# Patient Record
Sex: Female | Born: 1940 | Hispanic: No | Smoking: Former smoker
Health system: Southern US, Community
[De-identification: ages and names within clinical notes are randomized; demographics above are authoritative.]

## PROBLEM LIST (undated history)

## (undated) DIAGNOSIS — J449 Chronic obstructive pulmonary disease, unspecified: Secondary | ICD-10-CM

## (undated) DIAGNOSIS — I4891 Unspecified atrial fibrillation: Secondary | ICD-10-CM

## (undated) DIAGNOSIS — G8929 Other chronic pain: Secondary | ICD-10-CM

## (undated) DIAGNOSIS — I82409 Acute embolism and thrombosis of unspecified deep veins of unspecified lower extremity: Secondary | ICD-10-CM

## (undated) DIAGNOSIS — E079 Disorder of thyroid, unspecified: Secondary | ICD-10-CM

## (undated) DIAGNOSIS — M069 Rheumatoid arthritis, unspecified: Secondary | ICD-10-CM

## (undated) DIAGNOSIS — I509 Heart failure, unspecified: Secondary | ICD-10-CM

## (undated) DIAGNOSIS — I499 Cardiac arrhythmia, unspecified: Secondary | ICD-10-CM

## (undated) DIAGNOSIS — IMO0002 Reserved for concepts with insufficient information to code with codable children: Secondary | ICD-10-CM

## (undated) DIAGNOSIS — F32A Depression, unspecified: Secondary | ICD-10-CM

## (undated) DIAGNOSIS — I10 Essential (primary) hypertension: Secondary | ICD-10-CM

## (undated) DIAGNOSIS — M329 Systemic lupus erythematosus, unspecified: Secondary | ICD-10-CM

## (undated) HISTORY — DX: Chronic obstructive pulmonary disease, unspecified: J44.9

## (undated) HISTORY — DX: Heart failure, unspecified: I50.9

## (undated) HISTORY — DX: Acute embolism and thrombosis of unspecified deep veins of unspecified lower extremity: I82.409

## (undated) HISTORY — DX: Unspecified atrial fibrillation: I48.91

## (undated) HISTORY — PX: OTHER SURGICAL HISTORY: SHX169

## (undated) HISTORY — DX: Rheumatoid arthritis, unspecified: M06.9

## (undated) HISTORY — DX: Essential (primary) hypertension: I10

## (undated) HISTORY — DX: Other chronic pain: G89.29

## (undated) HISTORY — DX: Disorder of thyroid, unspecified: E07.9

---

## 1983-07-13 HISTORY — PX: ABDOMINAL HYSTERECTOMY: SHX81

## 1984-07-12 HISTORY — PX: OTHER SURGICAL HISTORY: SHX169

## 2008-04-10 HISTORY — PX: OTHER SURGICAL HISTORY: SHX169

## 2015-04-07 ENCOUNTER — Ambulatory Visit (INDEPENDENT_AMBULATORY_CARE_PROVIDER_SITE_OTHER): Payer: Medicare Other | Admitting: Diagnostic Neuroimaging

## 2015-04-07 ENCOUNTER — Encounter: Payer: Self-pay | Admitting: *Deleted

## 2015-04-07 VITALS — BP 135/77 | HR 62 | Ht 65.0 in | Wt 264.4 lb

## 2015-04-07 DIAGNOSIS — M5010 Cervical disc disorder with radiculopathy, unspecified cervical region: Secondary | ICD-10-CM | POA: Diagnosis not present

## 2015-04-07 DIAGNOSIS — M542 Cervicalgia: Secondary | ICD-10-CM | POA: Diagnosis not present

## 2015-04-07 NOTE — Progress Notes (Signed)
[facility]  PATIENT: Shirley Waters DOB: 1941/05/02  REFERRING CLINICIAN: Rockne Menghini HISTORY FROM: patient  REASON FOR VISIT: new consult    HISTORICAL  CHIEF COMPLAINT:  Chief Complaint  Patient presents with  . Numbness    rm 7, New Patient    HISTORY OF PRESENT ILLNESS:   74 year old ambidextrous female with rheumatoid arthritis, hypothyroidism, here for evaluation of numbness and tingling in shoulders and neck.  2009 patient was having problem with her right leg, weakness, numbness and pain. She went to several evaluation and ultimately was diagnosed with degenerative cervical spine disease and underwent surgical decompression and fusion of 4 levels in the cervical spine. This was performed in [place].  Over the past 2 months patient has had onset of left shoulder numbness and tingling which is intermittent. Symptoms are worse when patient turns her head to the left and are relieved when patient turns head to the right. Symptoms have been intermittent but gradually worsening. Numbness and tingling is increasing in frequency and duration/severity. Patient is also had intermittent episodes of tightness in her neck, especially posterior region. Sometimes she has a "cape-like sensation" on her back of her eye lateral shoulders and neck with tingling and but crawling sensations.  No symptoms radiating down to her elbows, fingers or hands. No problems down her spine or in her feet or legs. No bowel or bladder incontinence.   REVIEW OF SYSTEMS: Full 14 system review of systems performed and notable only for swelling in legs and easy bruising.  ALLERGIES: Allergies  Allergen Reactions  . Latex Rash  . Soy Allergy Other (See Comments)    Joint pain    HOME MEDICATIONS: No outpatient prescriptions prior to visit.   No facility-administered medications prior to visit.   Prior to Admission medications   Medication Sig Start Date End Date Taking?  Authorizing Provider  B Complex-C (B-COMPLEX WITH VITAMIN C) tablet Take 1 tablet by mouth daily.   Yes Historical Provider, MD  celecoxib (CELEBREX) 200 MG capsule Take 200 mg by mouth 2 (two) times daily.   Yes Historical Provider, MD  cholecalciferol (VITAMIN D) 1000 UNITS tablet Take 1,000 Units by mouth 2 (two) times daily before a meal.   Yes Historical Provider, MD  folic acid (FOLVITE) 1 MG tablet Take 1 mg by mouth daily.   Yes Historical Provider, MD  furosemide (LASIX) 40 MG tablet Take 40 mg by mouth. prn   Yes Historical Provider, MD  hydroxychloroquine (PLAQUENIL) 200 MG tablet  03/19/15  Yes Historical Provider, MD  methimazole (TAPAZOLE) 5 MG tablet 7.5 mg. 03/26/15  Yes Historical Provider, MD  metoprolol tartrate (LOPRESSOR) 25 MG tablet  03/19/15  Yes Historical Provider, MD  omeprazole (PRILOSEC) 20 MG capsule Take 20 mg by mouth daily.   Yes Historical Provider, MD  Oxycodone HCl 10 MG TABS  04/01/15  Yes Historical Provider, MD  predniSONE (DELTASONE) 5 MG tablet 1 tablet. 03/21/15  Yes Historical Provider, MD  warfarin (COUMADIN) 5 MG tablet 7.5 mg. 03/05/15  Yes Historical Provider, MD    PAST MEDICAL HISTORY: Past Medical History  Diagnosis Date  . Hypertension   . Atrial fibrillation   . COPD (chronic obstructive pulmonary disease)   . DVT (deep venous thrombosis)     history of, RLE  . Disorder of thyroid     secondary to RA  . Chronic pain   . RA (rheumatoid arthritis)   . CHF (congestive heart failure)     PAST  SURGICAL HISTORY: Past Surgical History  Procedure Laterality Date  . Knee replacement Bilateral 1971, 1972  . Abdominal hysterectomy  1985  . Choleycystectomy  1986  . Cervical disectomy  04/10/2008    4 vertebrae    FAMILY HISTORY: History reviewed. No pertinent family history.  SOCIAL HISTORY:  Social History   Social History  . Marital Status: Married    Spouse Name: N/A  . Number of Children: N/A  . Years of Education: N/A    Occupational History  . Not on file.   Social History Main Topics  . Smoking status: Former Smoker -- 44 years    Quit date: 03/26/2008  . Smokeless tobacco: Not on file  . Alcohol Use: No  . Drug Use: No  . Sexual Activity: Not on file   Other Topics Concern  . Not on file   Social History Narrative  . No narrative on file     PHYSICAL EXAM  GENERAL EXAM/CONSTITUTIONAL: Vitals:  Filed Vitals:   04/07/15 0924  BP: 135/77  Pulse: 62  Height: 5\' 5"  (1.651 m)  Weight: 264 lb 6.4 oz (119.931 kg)     Body mass index is 44 kg/(m^2).  Visual Acuity Screening   Right eye Left eye Both eyes  Without correction: 20/40 20/20   With correction:     Comments: 04/07/15 double vision in right eye, being monitored    Patient is in no distress; well developed, nourished and groomed  NECK HAS DECR ROM, ESP EXTENSION; MORE STIFFNESS WITH LEFT THAN RIGHT ROTATION  ULNAR DEVIATION OF FINGERS  CARDIOVASCULAR:  Examination of carotid arteries is normal; no carotid bruits  Regular rate and rhythm, no murmurs  Examination of peripheral vascular system by observation and palpation is normal  EYES:  Ophthalmoscopic exam of optic discs and posterior segments is normal; no papilledema or hemorrhages  MUSCULOSKELETAL:  Gait, strength, tone, movements noted in Neurologic exam below  NEUROLOGIC: MENTAL STATUS:  No flowsheet data found.  awake, alert, oriented to person, place and time  recent and remote memory intact  normal attention and concentration  language fluent, comprehension intact, naming intact,   fund of knowledge appropriate  CRANIAL NERVE:   2nd - no papilledema on fundoscopic exam  2nd, 3rd, 4th, 6th - pupils equal and reactive to light, visual fields full to confrontation, extraocular muscles intact, no nystagmus  5th - facial sensation symmetric  7th - facial strength symmetric  8th - hearing intact  9th - palate elevates symmetrically,  uvula midline  11th - shoulder shrug symmetric  12th - tongue protrusion midline  MOTOR:   normal bulk and tone, full strength in the BUE, BLE  SENSORY:   normal and symmetric to light touch, pinprick, temperature, vibration  COORDINATION:   finger-nose-finger, fine finger movements normal  REFLEXES:   deep tendon reflexes TRACE and symmetric  GAIT/STATION:   narrow based gait; SLOW TO RISE. SLOW UNSTEADY GAIT.    DIAGNOSTIC DATA (LABS, IMAGING, TESTING) - I reviewed patient records, labs, notes, testing and imaging myself where available.  No results found for: WBC, HGB, HCT, MCV, PLT No results found for: NA, K, CL, CO2, GLUCOSE, BUN, CREATININE, CALCIUM, PROT, ALBUMIN, AST, ALT, ALKPHOS, BILITOT, GFRNONAA, GFRAA No results found for: CHOL, HDL, LDLCALC, LDLDIRECT, TRIG, CHOLHDL No results found for: VUDT1Y No results found for: VITAMINB12 No results found for: TSH     ASSESSMENT AND PLAN  74 y.o. year old female here with intermittent left shoulder numbness, aggravated  by head and neck positioning, now gradually increasing in severity and frequency. Findings suspicious for cervical spine localization, with considerations including cervical radiculopathy, syringomyelia, peripheral neuropathy. Will proceed with further workup with MRI of the cervical spine. Then may consider treatment options based on test results.  Ddx: syrinx, cervical radiculopathy, peripheral neuropathy   PLAN: - check MRI cervical spine  - then may consider treatment options (surgical vs conservative)  Orders Placed This Encounter  Procedures  . MR Cervical Spine Wo Contrast   Return in about 6 weeks (around 05/19/2015).    Suanne Marker, MD 04/07/2015, 10:03 AM Certified in Neurology, Neurophysiology and Neuroimaging  [facility] [address], [address] [place], [place] [address] [phone]

## 2015-04-07 NOTE — Patient Instructions (Signed)
Thank you for coming to see [place] at [facility]. I hope we have been able to provide you high quality care today.  You may receive a patient satisfaction survey over the next few weeks. We would appreciate your feedback and comments so that we may continue to improve ourselves and the health of our patients.  - I will check MRI cervical spine   ~~~~~~~~~~~~~~~~~~~~~~~~~~~~~~~~~~~~~~~~~~~~~~~~~~~~~~~~~~~~~~~~~  DR. PENUMALLI'S GUIDE TO HAPPY AND HEALTHY LIVING These are some of my general health and wellness recommendations. Some of them may apply to you better than others. Please use common sense as you try these suggestions and feel free to ask me any questions.   ACTIVITY/FITNESS Mental, social, emotional and physical stimulation are very important for brain and body health. Try learning a new activity (arts, music, language, sports, games).  Keep moving your body to the best of your abilities. You can do this at home, inside or outside, the park, community center, gym or anywhere you like. Consider a physical therapist or personal trainer to get started. Consider the app Sworkit. Fitness trackers such as smart-watches, smart-phones or Fitbits can help as well.   NUTRITION Eat more plants: colorful vegetables, nuts, seeds and berries. CAUTION WITH WARFARIN AND FOOD RESTRICTIONS.  Eat less sugar, salt, preservatives and processed foods.  Avoid toxins such as cigarettes and alcohol.  Drink water when you are thirsty. Warm water with a slice of lemon is an excellent morning drink to start the day.  Consider these websites for more information The Nutrition Source (https://www.henry-hernandez.biz/) Precision Nutrition (WindowBlog.ch)   RELAXATION Consider practicing mindfulness meditation or other relaxation techniques such as deep breathing, prayer, yoga, tai chi, massage. See website mindful.org or the apps Headspace or  Calm to help get started.   SLEEP Try to get at least 7-8+ hours sleep per day. Regular exercise and reduced caffeine will help you sleep better. Practice good sleep hygeine techniques. See website sleep.org for more information.   PLANNING Prepare estate planning, living will, healthcare POA documents. Sometimes this is best planned with the help of an attorney. Theconversationproject.org and agingwithdignity.org are excellent resources.

## 2015-04-10 ENCOUNTER — Ambulatory Visit
Admission: RE | Admit: 2015-04-10 | Discharge: 2015-04-10 | Disposition: A | Discharge status: Home / Self Care | Payer: Medicare Other | Source: Ambulatory Visit | Attending: Diagnostic Neuroimaging | Admitting: Diagnostic Neuroimaging

## 2015-04-10 DIAGNOSIS — M542 Cervicalgia: Secondary | ICD-10-CM

## 2015-04-10 DIAGNOSIS — M5010 Cervical disc disorder with radiculopathy, unspecified cervical region: Secondary | ICD-10-CM

## 2015-04-14 ENCOUNTER — Telehealth: Payer: Self-pay | Admitting: Diagnostic Neuroimaging

## 2015-04-14 DIAGNOSIS — M4802 Spinal stenosis, cervical region: Secondary | ICD-10-CM

## 2015-04-14 DIAGNOSIS — M5010 Cervical disc disorder with radiculopathy, unspecified cervical region: Secondary | ICD-10-CM

## 2015-04-14 NOTE — Telephone Encounter (Signed)
Patient is calling and states she would like to know the results of her MRI test as she is not feeling at all well and doesn't want to wait for her 05/21/15 appointment to find out what is wrong.  Please call.

## 2015-04-14 NOTE — Telephone Encounter (Signed)
I called patient. Reviewed MRI results. Will setup neurosurg referral to look at surgical options vs conservative pain mgmt. -VRP

## 2015-04-16 ENCOUNTER — Telehealth: Payer: Self-pay | Admitting: Diagnostic Neuroimaging

## 2015-04-16 NOTE — Telephone Encounter (Signed)
Spoke with patient and I relayed to give it a few days for insurance process . Patient understood and she will call me back if she is not scheduled with in next two weeks.

## 2015-04-16 NOTE — Telephone Encounter (Signed)
Pt called inquiring about out going referral. She can be reached at [phone].

## 2015-04-24 ENCOUNTER — Other Ambulatory Visit: Payer: Self-pay | Admitting: Neurosurgery

## 2015-04-24 DIAGNOSIS — M5021 Other cervical disc displacement,  high cervical region: Secondary | ICD-10-CM

## 2015-05-08 ENCOUNTER — Ambulatory Visit
Admission: RE | Admit: 2015-05-08 | Discharge: 2015-05-08 | Disposition: A | Discharge status: Home / Self Care | Payer: Medicare Other | Source: Ambulatory Visit | Attending: Neurosurgery | Admitting: Neurosurgery

## 2015-05-08 DIAGNOSIS — M5021 Other cervical disc displacement,  high cervical region: Secondary | ICD-10-CM

## 2015-05-21 ENCOUNTER — Ambulatory Visit: Payer: Medicare Other | Admitting: Diagnostic Neuroimaging

## 2017-08-10 ENCOUNTER — Other Ambulatory Visit: Payer: Self-pay | Admitting: Neurosurgery

## 2017-08-10 DIAGNOSIS — G959 Disease of spinal cord, unspecified: Secondary | ICD-10-CM

## 2017-08-20 ENCOUNTER — Ambulatory Visit
Admission: RE | Admit: 2017-08-20 | Discharge: 2017-08-20 | Disposition: A | Discharge status: Home / Self Care | Payer: Medicare Other | Source: Ambulatory Visit | Attending: Neurosurgery | Admitting: Neurosurgery

## 2017-08-20 DIAGNOSIS — G959 Disease of spinal cord, unspecified: Secondary | ICD-10-CM

## 2017-10-12 ENCOUNTER — Ambulatory Visit (INDEPENDENT_AMBULATORY_CARE_PROVIDER_SITE_OTHER): Payer: Medicare Other | Admitting: Internal Medicine

## 2017-10-12 ENCOUNTER — Encounter: Payer: Self-pay | Admitting: Internal Medicine

## 2017-10-12 DIAGNOSIS — M5030 Other cervical disc degeneration, unspecified cervical region: Secondary | ICD-10-CM | POA: Diagnosis not present

## 2017-10-12 DIAGNOSIS — L97101 Non-pressure chronic ulcer of unspecified thigh limited to breakdown of skin: Secondary | ICD-10-CM | POA: Diagnosis not present

## 2017-10-12 DIAGNOSIS — M329 Systemic lupus erythematosus, unspecified: Secondary | ICD-10-CM | POA: Diagnosis not present

## 2017-10-12 DIAGNOSIS — I83001 Varicose veins of unspecified lower extremity with ulcer of thigh: Secondary | ICD-10-CM

## 2017-10-13 DIAGNOSIS — M5030 Other cervical disc degeneration, unspecified cervical region: Secondary | ICD-10-CM | POA: Insufficient documentation

## 2017-10-13 DIAGNOSIS — M069 Rheumatoid arthritis, unspecified: Secondary | ICD-10-CM | POA: Insufficient documentation

## 2017-10-13 DIAGNOSIS — E669 Obesity, unspecified: Secondary | ICD-10-CM | POA: Insufficient documentation

## 2017-10-13 DIAGNOSIS — L97909 Non-pressure chronic ulcer of unspecified part of unspecified lower leg with unspecified severity: Secondary | ICD-10-CM

## 2017-10-13 DIAGNOSIS — I83009 Varicose veins of unspecified lower extremity with ulcer of unspecified site: Secondary | ICD-10-CM | POA: Insufficient documentation

## 2017-10-13 DIAGNOSIS — M329 Systemic lupus erythematosus, unspecified: Secondary | ICD-10-CM | POA: Insufficient documentation

## 2017-10-13 NOTE — Progress Notes (Signed)
[facility]  Reason for Consult: Venous stasis ulcers Referring Physician: Dr. Rometta Emery  Assessment: Based on her history she probably did have some superficial infection of her venous stasis ulcer but there is none evident now.  She will finish the last few days of her clindamycin and then I would recommend observation off of antibiotics.  I would recommend continued wound care.  She will follow-up here in 1 month.  I tried to reassure her that I seriously doubt that she will develop systemic infection or other complications of this.   Plan: 1. Complete 4 more days of clindamycin and then observe off of antibiotics 2. Continue wound care 3. Follow-up here in 4 weeks  Patient Active Problem List   Diagnosis Date Noted  . Venous stasis ulcers (HCC) 10/13/2017    Priority: High  . Rheumatoid arthritis (HCC) 10/13/2017  . Obesity 10/13/2017  . Degenerative disc disease, cervical 10/13/2017  . Lupus (HCC) 10/13/2017    Patient's Medications  New Prescriptions   No medications on file  Previous Medications   B COMPLEX-C (B-COMPLEX WITH VITAMIN C) TABLET    Take 1 tablet by mouth daily.   CELECOXIB (CELEBREX) 200 MG CAPSULE    Celebrex 200 mg capsule  Take 1 capsule twice a day by oral route as directed.   CHOLECALCIFEROL (VITAMIN D) 1000 UNITS TABLET    Take 1,000 Units by mouth 2 (two) times daily before a meal.   CLINDAMYCIN (CLEOCIN) 300 MG CAPSULE       FUROSEMIDE (LASIX) 40 MG TABLET    furosemide 40 mg tablet   HALOBETASOL (ULTRAVATE) 0.05 % OINTMENT    halobetasol propionate 0.05 % topical ointment   HYDROXYCHLOROQUINE (PLAQUENIL) 200 MG TABLET       METHIMAZOLE (TAPAZOLE) 5 MG TABLET    methimazole 5 mg tablet  TAKE 1 1/2 TABLETS BY MOUTH ONCE A DAY   METHOTREXATE 2.5 MG TABLET    methotrexate sodium 2.5 mg tablet  TAKE 8 TABLETs (2.5 MG) BY ORAL ROUTE ONCE WEEKLY per Dr Octaviano Glow   METOLAZONE (ZAROXOLYN) 5 MG TABLET    metolazone  5 mg tablet  Take 1 tablet every day by oral route as needed.   METOPROLOL TARTRATE (LOPRESSOR) 25 MG TABLET       MULTIPLE VITAMINS-MINERALS (MULTIVITAMIN ADULT EXTRA C PO)    multivitamin  OTC   OMEPRAZOLE (PRILOSEC) 20 MG CAPSULE    Take 20 mg by mouth daily.   OXYCODONE (ROXICODONE) 15 MG IMMEDIATE RELEASE TABLET    oxycodone 15 mg tablet   OXYCODONE HCL 10 MG TABS       PREDNISONE (DELTASONE) 10 MG TABLET    prednisone 10 mg tablet   PREDNISONE (DELTASONE) 5 MG TABLET    1 tablet.   WARFARIN (COUMADIN) 5 MG TABLET    7.5 mg.  Modified Medications   No medications on file  Discontinued Medications   CELECOXIB (CELEBREX) 200 MG CAPSULE    Take 200 mg by mouth 2 (two) times daily.   FOLIC ACID (FOLVITE) 1 MG TABLET    Take 1 mg by mouth daily.   FUROSEMIDE (LASIX) 40 MG TABLET    Take 40 mg by mouth. prn   METHIMAZOLE (TAPAZOLE) 5 MG TABLET    7.5 mg.    HPI: Shirley Waters is a 77 y.o. female with chronic venous stasis who developed bilateral ankle ulcers last year.  She has been followed at  the wound clinic in [place], [place].  Records indicate that her left leg ulcer healed completely recently.  Her right ankle ulcer has been improving.  She became worried when she noted some malodor from the ulcer.  She mentioned this to her neurosurgeon, Dr. Donalee Citrin, whom she was seen because of ongoing neck pain.  She tells me that he suggested getting a culture.  The wound was cultured on 09/02/2017 and grew MRSA, Proteus mirabilis and Klebsiella oxytoca.  She had a 2-week course of trimethoprim sulfamethoxazole.  The odor resolved promptly.  The ulcer was cultured again on 09/27/2017 and again yielded MRSA.  She started on oral clindamycin about 10 days ago.  She states the ulcer continues to improve and get smaller.  She has been extremely worried that the MRSA could cause life-threatening infection.  Her daughter suffered a stroke at an early age and had to live in a skilled nursing facility.  She  died suddenly of sepsis.  Ms. Iannacone states that she has been concerned that the same thing would happen to her.  She is also concerned that she would not be able to undergo C-spine surgery because of active infection.  She underwent vein ablation in both legs in February.  She feels like her ulcers began to improve following those procedures.  Review of Systems: Review of Systems  Constitutional: Negative for chills, diaphoresis, fever and weight loss.  Gastrointestinal: Negative for abdominal pain, diarrhea, nausea and vomiting.  Skin:       As noted in HPI.      Past Medical History:  Diagnosis Date  . Atrial fibrillation (HCC)   . CHF (congestive heart failure) (HCC)   . Chronic pain   . COPD (chronic obstructive pulmonary disease) (HCC)   . 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 DVT (deep venous thrombosis) (HCC)    history of, RLE  . Hypertension   . RA (rheumatoid arthritis) (HCC)     Social History   Tobacco Use  . Smoking status: Former Smoker    Years: 44.00    Last attempt to quit: 03/26/2008    Years since quitting: 9.5  . Smokeless tobacco: Never Used  Substance Use Topics  . Alcohol use: No    Alcohol/week: 0.0 oz  . Drug use: No    Family History  Problem Relation Age of Onset  . Stroke Daughter    Allergies  Allergen Reactions  . Latex Rash  . Soy Allergy Other (See Comments)    Joint pain  . Levofloxacin Palpitations    OBJECTIVE: Vitals:   10/12/17 1620  BP: (!) 156/78  Pulse: 89  Temp: 98.5 F (36.9 C)  TempSrc: Oral  Weight: 253 lb (114.8 kg)   Body mass index is 42.1 kg/m.   Physical Exam  Constitutional: She is oriented to person, place, and time.  She is very pleasant but somewhat anxious.  She is very talkative.  Musculoskeletal:  She has 1+ pitting edema to the level of the knees in both legs.  She has a 12 mm superficial ulcer on her medial right ankle.  There is surrounding hyperpigmentation and dry skin.  There is no  drainage or odor.  There is no surrounding cellulitis.  Neurological: She is alert and oriented to person, place, and time.  Psychiatric: Mood and affect normal.    Microbiology: No results found for this or any previous visit (from the past 240 hour(s)).  Cliffton Asters, MD [facility] [phone] pager   [phone] cell 10/13/2017, 12:49 PM

## 2017-10-13 NOTE — Assessment & Plan Note (Signed)
Based on her history she probably did have some superficial infection of her venous stasis ulcer but there is none evident now.  She will finish the last few days of her clindamycin and then I would recommend observation off of antibiotics.  I would recommend continued wound care.  She will follow-up here in 1 month.  I tried to reassure her that I seriously doubt that she will develop systemic infection or other complications of this.

## 2017-11-08 ENCOUNTER — Ambulatory Visit: Payer: Medicare Other | Admitting: Internal Medicine

## 2017-11-08 ENCOUNTER — Telehealth: Payer: Self-pay | Admitting: *Deleted

## 2017-11-08 NOTE — Telephone Encounter (Signed)
Patient called to advise that after her visit here on 10/12/17 she woke the next morning and was taken to the hospital, [facility] in [place]. She was diagnosed with cellulitis in her left leg and treated with zosyn and vanc IV. She was transferred to [facility] for 4-6 weeks inpatient stay. Since she has been there she now has a large ulcer on her right leg. And added IV Cipro to her regimen after an ultrasound of her leg. She was very emotional and is not confident in the care she is getting at the facility and wants to be seen by Dr Orvan Falconer and the ID team here. She is afraid to leave the facility but does not want to stay there. Advised here we do not have privileges there and not sure what Dr Orvan Falconer can do but if she does get here we will see her and I will let him know of her concerns and medication changes.  She was unclear of dosages of the new medications she is on.

## 2017-11-09 NOTE — Telephone Encounter (Signed)
Unfortunately, she missed her appointment with me yesterday.  I would be happy to see her if she has transportation and can reschedule.

## 2017-11-22 ENCOUNTER — Ambulatory Visit: Payer: Medicare Other | Admitting: Internal Medicine

## 2018-02-28 ENCOUNTER — Other Ambulatory Visit: Payer: Self-pay

## 2018-02-28 ENCOUNTER — Encounter (HOSPITAL_COMMUNITY): Payer: Self-pay

## 2018-02-28 ENCOUNTER — Inpatient Hospital Stay (HOSPITAL_COMMUNITY)
Admission: EM | Admit: 2018-02-28 | Discharge: 2018-03-07 | DRG: 571 | Disposition: A | Discharge status: Home Health | Payer: Medicare Other | Attending: Internal Medicine | Admitting: Internal Medicine

## 2018-02-28 DIAGNOSIS — Z8614 Personal history of Methicillin resistant Staphylococcus aureus infection: Secondary | ICD-10-CM

## 2018-02-28 DIAGNOSIS — I482 Chronic atrial fibrillation: Secondary | ICD-10-CM | POA: Diagnosis present

## 2018-02-28 DIAGNOSIS — L97828 Non-pressure chronic ulcer of other part of left lower leg with other specified severity: Principal | ICD-10-CM | POA: Diagnosis present

## 2018-02-28 DIAGNOSIS — Z823 Family history of stroke: Secondary | ICD-10-CM

## 2018-02-28 DIAGNOSIS — N3941 Urge incontinence: Secondary | ICD-10-CM | POA: Diagnosis present

## 2018-02-28 DIAGNOSIS — I4821 Permanent atrial fibrillation: Secondary | ICD-10-CM | POA: Diagnosis present

## 2018-02-28 DIAGNOSIS — L97909 Non-pressure chronic ulcer of unspecified part of unspecified lower leg with unspecified severity: Secondary | ICD-10-CM | POA: Diagnosis present

## 2018-02-28 DIAGNOSIS — Z7952 Long term (current) use of systemic steroids: Secondary | ICD-10-CM

## 2018-02-28 DIAGNOSIS — Z6841 Body Mass Index (BMI) 40.0 and over, adult: Secondary | ICD-10-CM

## 2018-02-28 DIAGNOSIS — Z96653 Presence of artificial knee joint, bilateral: Secondary | ICD-10-CM

## 2018-02-28 DIAGNOSIS — M069 Rheumatoid arthritis, unspecified: Secondary | ICD-10-CM | POA: Diagnosis present

## 2018-02-28 DIAGNOSIS — Z7901 Long term (current) use of anticoagulants: Secondary | ICD-10-CM

## 2018-02-28 DIAGNOSIS — L97818 Non-pressure chronic ulcer of other part of right lower leg with other specified severity: Secondary | ICD-10-CM | POA: Diagnosis present

## 2018-02-28 DIAGNOSIS — D649 Anemia, unspecified: Secondary | ICD-10-CM | POA: Diagnosis present

## 2018-02-28 DIAGNOSIS — L03116 Cellulitis of left lower limb: Secondary | ICD-10-CM | POA: Diagnosis present

## 2018-02-28 DIAGNOSIS — L03115 Cellulitis of right lower limb: Secondary | ICD-10-CM | POA: Diagnosis present

## 2018-02-28 DIAGNOSIS — I10 Essential (primary) hypertension: Secondary | ICD-10-CM | POA: Diagnosis present

## 2018-02-28 DIAGNOSIS — T148XXA Other injury of unspecified body region, initial encounter: Secondary | ICD-10-CM

## 2018-02-28 DIAGNOSIS — Z8249 Family history of ischemic heart disease and other diseases of the circulatory system: Secondary | ICD-10-CM

## 2018-02-28 DIAGNOSIS — M329 Systemic lupus erythematosus, unspecified: Secondary | ICD-10-CM | POA: Diagnosis present

## 2018-02-28 DIAGNOSIS — I83009 Varicose veins of unspecified lower extremity with ulcer of unspecified site: Secondary | ICD-10-CM | POA: Diagnosis present

## 2018-02-28 DIAGNOSIS — Z79899 Other long term (current) drug therapy: Secondary | ICD-10-CM

## 2018-02-28 DIAGNOSIS — Z87891 Personal history of nicotine dependence: Secondary | ICD-10-CM

## 2018-02-28 DIAGNOSIS — Z86718 Personal history of other venous thrombosis and embolism: Secondary | ICD-10-CM

## 2018-02-28 DIAGNOSIS — L089 Local infection of the skin and subcutaneous tissue, unspecified: Secondary | ICD-10-CM

## 2018-02-28 DIAGNOSIS — J449 Chronic obstructive pulmonary disease, unspecified: Secondary | ICD-10-CM | POA: Diagnosis present

## 2018-02-28 DIAGNOSIS — L03119 Cellulitis of unspecified part of limb: Secondary | ICD-10-CM | POA: Diagnosis not present

## 2018-02-28 DIAGNOSIS — M1990 Unspecified osteoarthritis, unspecified site: Secondary | ICD-10-CM | POA: Diagnosis present

## 2018-02-28 DIAGNOSIS — I872 Venous insufficiency (chronic) (peripheral): Secondary | ICD-10-CM | POA: Diagnosis present

## 2018-02-28 DIAGNOSIS — K5900 Constipation, unspecified: Secondary | ICD-10-CM | POA: Diagnosis present

## 2018-02-28 LAB — CBC WITH DIFFERENTIAL/PLATELET
ABS IMMATURE GRANULOCYTES: 0 10*3/uL (ref 0.0–0.1)
BASOS ABS: 0 10*3/uL (ref 0.0–0.1)
Basophils Relative: 1 %
Eosinophils Absolute: 0.4 10*3/uL (ref 0.0–0.7)
Eosinophils Relative: 7 %
HCT: 32.7 % — ABNORMAL LOW (ref 36.0–46.0)
HEMOGLOBIN: 9.6 g/dL — AB (ref 12.0–15.0)
Immature Granulocytes: 0 %
LYMPHS PCT: 27 %
Lymphs Abs: 1.7 10*3/uL (ref 0.7–4.0)
MCH: 25.7 pg — ABNORMAL LOW (ref 26.0–34.0)
MCHC: 29.4 g/dL — AB (ref 30.0–36.0)
MCV: 87.4 fL (ref 78.0–100.0)
Monocytes Absolute: 0.7 10*3/uL (ref 0.1–1.0)
Monocytes Relative: 11 %
Neutro Abs: 3.2 10*3/uL (ref 1.7–7.7)
Neutrophils Relative %: 54 %
Platelets: 348 10*3/uL (ref 150–400)
RBC: 3.74 MIL/uL — AB (ref 3.87–5.11)
RDW: 15 % (ref 11.5–15.5)
WBC: 6 10*3/uL (ref 4.0–10.5)

## 2018-02-28 LAB — COMPREHENSIVE METABOLIC PANEL
ALBUMIN: 2.9 g/dL — AB (ref 3.5–5.0)
ALT: 9 U/L (ref 0–44)
ANION GAP: 14 (ref 5–15)
AST: 20 U/L (ref 15–41)
Alkaline Phosphatase: 65 U/L (ref 38–126)
BILIRUBIN TOTAL: 0.7 mg/dL (ref 0.3–1.2)
BUN: 9 mg/dL (ref 8–23)
CO2: 34 mmol/L — ABNORMAL HIGH (ref 22–32)
Calcium: 9.3 mg/dL (ref 8.9–10.3)
Chloride: 89 mmol/L — ABNORMAL LOW (ref 98–111)
Creatinine, Ser: 1.18 mg/dL — ABNORMAL HIGH (ref 0.44–1.00)
GFR calc Af Amer: 51 mL/min — ABNORMAL LOW (ref >60)
GFR, EST NON AFRICAN AMERICAN: 44 mL/min — AB (ref >60)
Glucose, Bld: 100 mg/dL — ABNORMAL HIGH (ref 70–99)
POTASSIUM: 3.2 mmol/L — AB (ref 3.5–5.1)
Sodium: 137 mmol/L (ref 135–145)
TOTAL PROTEIN: 7.6 g/dL (ref 6.5–8.1)

## 2018-02-28 LAB — I-STAT CG4 LACTIC ACID, ED
LACTIC ACID, VENOUS: 2.4 mmol/L — AB (ref 0.5–1.9)
LACTIC ACID, VENOUS: 2.59 mmol/L — AB (ref 0.5–1.9)

## 2018-02-28 LAB — PROTIME-INR
INR: 2.94
PROTHROMBIN TIME: 30.4 s — AB (ref 11.4–15.2)

## 2018-02-28 MED ORDER — ENOXAPARIN SODIUM 40 MG/0.4ML ~~LOC~~ SOLN: 40.0000 mg | SUBCUTANEOUS | Status: DC

## 2018-02-28 MED ORDER — ACETAMINOPHEN 650 MG RE SUPP: 650.0000 mg | Freq: Four times a day (QID) | RECTAL | Status: DC | PRN

## 2018-02-28 MED ORDER — DOXYCYCLINE HYCLATE 100 MG PO TABS
100.0000 mg | ORAL_TABLET | Freq: Two times a day (BID) | ORAL | Status: DC
  Administered 2018-02-28 – 2018-03-01 (×2): 100 mg via ORAL
  Filled 2018-02-28 (×3): qty 1

## 2018-02-28 MED ORDER — ACETAMINOPHEN 325 MG PO TABS
650.0000 mg | ORAL_TABLET | Freq: Four times a day (QID) | ORAL | Status: DC | PRN
  Administered 2018-03-01 – 2018-03-06 (×3): 650 mg via ORAL
  Filled 2018-02-28 (×3): qty 2

## 2018-02-28 MED ORDER — HYDROXYCHLOROQUINE SULFATE 200 MG PO TABS
200.0000 mg | ORAL_TABLET | Freq: Two times a day (BID) | ORAL | Status: DC
  Administered 2018-03-01 – 2018-03-07 (×13): 200 mg via ORAL
  Filled 2018-02-28 (×14): qty 1

## 2018-02-28 MED ORDER — SENNA 8.6 MG PO TABS
1.0000 | ORAL_TABLET | Freq: Two times a day (BID) | ORAL | Status: DC
  Administered 2018-03-01 – 2018-03-07 (×12): 8.6 mg via ORAL
  Filled 2018-02-28 (×13): qty 1

## 2018-02-28 MED ORDER — METOPROLOL TARTRATE 25 MG PO TABS
25.0000 mg | ORAL_TABLET | Freq: Two times a day (BID) | ORAL | Status: DC
  Administered 2018-03-01: 25 mg via ORAL
  Filled 2018-02-28 (×2): qty 1

## 2018-02-28 MED ORDER — PREDNISONE 5 MG PO TABS
5.0000 mg | ORAL_TABLET | Freq: Every day | ORAL | Status: DC
  Administered 2018-03-01 – 2018-03-02 (×2): 10 mg via ORAL
  Filled 2018-02-28 (×2): qty 2

## 2018-02-28 MED ORDER — ONDANSETRON HCL 4 MG PO TABS: 4.0000 mg | ORAL_TABLET | Freq: Four times a day (QID) | ORAL | Status: DC | PRN

## 2018-02-28 MED ORDER — WARFARIN SODIUM 5 MG PO TABS
5.0000 mg | ORAL_TABLET | Freq: Once | ORAL | Status: AC
  Administered 2018-02-28: 5 mg via ORAL
  Filled 2018-02-28: qty 1

## 2018-02-28 MED ORDER — ONDANSETRON HCL 4 MG/2ML IJ SOLN: 4.0000 mg | Freq: Four times a day (QID) | INTRAMUSCULAR | Status: DC | PRN

## 2018-02-28 MED ORDER — METHIMAZOLE 5 MG PO TABS
7.5000 mg | ORAL_TABLET | Freq: Every day | ORAL | Status: DC
  Administered 2018-02-28 – 2018-03-07 (×8): 7.5 mg via ORAL
  Filled 2018-02-28 (×9): qty 2

## 2018-02-28 MED ORDER — WARFARIN - PHARMACIST DOSING INPATIENT: Freq: Every day | Status: DC

## 2018-02-28 MED ORDER — SODIUM CHLORIDE 0.9 % IV BOLUS
500.0000 mL | Freq: Once | INTRAVENOUS | Status: AC
  Administered 2018-02-28: 500 mL via INTRAVENOUS

## 2018-02-28 MED ORDER — POLYETHYLENE GLYCOL 3350 17 G PO PACK
17.0000 g | PACK | Freq: Every day | ORAL | Status: DC | PRN
  Administered 2018-03-07: 17 g via ORAL

## 2018-02-28 MED ORDER — CLINDAMYCIN PHOSPHATE 600 MG/50ML IV SOLN
600.0000 mg | Freq: Once | INTRAVENOUS | Status: AC
  Administered 2018-02-28: 600 mg via INTRAVENOUS
  Filled 2018-02-28: qty 50

## 2018-02-28 MED ORDER — OXYCODONE HCL 5 MG PO TABS
10.0000 mg | ORAL_TABLET | Freq: Four times a day (QID) | ORAL | Status: DC | PRN
  Administered 2018-02-28 – 2018-03-04 (×12): 10 mg via ORAL
  Filled 2018-02-28 (×12): qty 2

## 2018-02-28 NOTE — ED Triage Notes (Signed)
Pt presents for evaluation of bilateral lower limb cellulitis. Pt reports this has been recurrent for several months, states recently treated at [facility] but did not want to return there. No fevers.

## 2018-02-28 NOTE — Progress Notes (Signed)
ANTICOAGULATION CONSULT NOTE - Initial Consult  Pharmacy Consult for warfarin Indication: atrial fibrillation  Allergies  Allergen Reactions  . Latex Rash  . Levofloxacin Palpitations   Vital Signs: Temp: 97.9 F (36.6 C) (08/20 1340) Temp Source: Oral (08/20 1340) BP: 106/71 (08/20 1630) Pulse Rate: 112 (08/20 1630)  Labs: Recent Labs    02/28/18 1246 02/28/18 1528  HGB 9.6*  --   HCT 32.7*  --   PLT 348  --   LABPROT  --  30.4*  INR  --  2.94  CREATININE 1.18*  --     CrCl cannot be calculated (Unknown ideal weight.).   Medical History: Past Medical History:  Diagnosis Date  . Atrial fibrillation (HCC)   . CHF (congestive heart failure) (HCC)   . Chronic pain   . COPD (chronic obstructive pulmonary disease) (HCC)   . 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 DVT (deep venous thrombosis) (HCC)    history of, RLE  . Hypertension   . RA (rheumatoid arthritis) (HCC)     Medications:  Medications Prior to Admission  Medication Sig Dispense Refill Last Dose  . Bismuth Tribromoph-Petrolatum (XEROFORM PETROLAT GAUZE 1"X8" EX) Apply 1 application topically 3 (three) times daily.   02/27/2018 at Unknown time  . celecoxib (CELEBREX) 200 MG capsule Take by mouth 2 (two) times daily.    02/28/2018 at Unknown time  . Cholecalciferol (VITAMIN D PO) Take 5,000 Units by mouth daily.   02/27/2018 at Unknown time  . furosemide (LASIX) 40 MG tablet Take 40 mg by mouth as needed for fluid.    02/26/2018  . hydroxychloroquine (PLAQUENIL) 200 MG tablet Take 200 mg by mouth 2 (two) times daily.    02/28/2018 at Unknown time  . methimazole (TAPAZOLE) 5 MG tablet Take 7.5 mg by mouth daily.    02/27/2018 at Unknown time  . methotrexate 2.5 MG tablet Take 20 mg by mouth once a week. Taking 8 tablets (2.5mg ) weekly   02/26/2018  . metolazone (ZAROXOLYN) 5 MG tablet Take 5 mg by mouth daily as needed (for fluid. takes with furosemide).    02/26/2018  . metoprolol tartrate (LOPRESSOR) 25 MG  tablet Take 25 mg by mouth 2 (two) times daily.    02/28/2018 at 0800  . Multiple Vitamins-Minerals (MULTIVITAMIN ADULT EXTRA C PO) Take 1 tablet by mouth daily.    Past Week at Unknown time  . oxyCODONE (ROXICODONE) 15 MG immediate release tablet Take 15 mg by mouth every 6 (six) hours as needed for pain.    02/28/2018 at 01000  . Oxycodone HCl 10 MG TABS Take 10 mg by mouth daily as needed (severe pain).    02/27/2018 at Unknown time  . predniSONE (DELTASONE) 5 MG tablet Take 5-10 tablets by mouth See admin instructions. Taking one 5mg  or one 10mg  tablet daily depending on arthritis symptoms   02/27/2018 at Unknown time  . Propylene Glycol (SYSTANE BALANCE) 0.6 % SOLN Apply 1 drop to eye as needed (dry eyes).   Past Week at Unknown time  . warfarin (COUMADIN) 5 MG tablet Take 5 mg by mouth one time only at 6 PM.    02/27/2018 at Unknown time  . predniSONE (DELTASONE) 10 MG tablet 5-10 mg daily with breakfast.    Not Taking at Unknown time    Assessment: 39 yoF on warfarin for atrial fibrillation. INR on admit 2.94, with last dose PTA on 8/19. HgB 9.6, PLT 348. Warfarin 5mg  QD reported  as PTA warfarin regimen. Patient here from [place] and there are no anticoagulation notes found in Care Everywhere.  Goal of Therapy:  INR 2-3 Monitor platelets by anticoagulation protocol: Yes   Plan:  Warfarin 5mg  PO x1 tonight Daily CBC/ INR  Monitor for s/sx of bleeding  , PharmD Clinical Pharmacist 02/28/2018 5:05 PM Please check AMION for all [facility] numbers

## 2018-02-28 NOTE — Plan of Care (Signed)
  Problem: Education: Goal: Knowledge of General Education information will improve Description Including pain rating scale, medication(s)/side effects and non-pharmacologic comfort measures Outcome: Progressing   Problem: Clinical Measurements: Goal: Ability to maintain clinical measurements within normal limits will improve Outcome: Progressing   Problem: Clinical Measurements: Goal: Will remain free from infection Outcome: Progressing   Problem: Activity: Goal: Risk for activity intolerance will decrease Outcome: Progressing   Problem: Elimination: Goal: Will not experience complications related to bowel motility Outcome: Progressing   Problem: Pain Managment: Goal: General experience of comfort will improve Outcome: Progressing   Problem: Safety: Goal: Ability to remain free from injury will improve Outcome: Progressing   

## 2018-02-28 NOTE — ED Provider Notes (Addendum)
[facility] EMERGENCY DEPARTMENT Provider Note   CSN: [id number] Arrival date & time: 02/28/18  1212     History   Chief Complaint Chief Complaint  Patient presents with  . Leg Pain    HPI Shirley Waters is a 77 y.o. female.  Patient is a 77 year old female with a history of atrial fibrillation on Coumadin, hypertension, rheumatoid arthritis, CHF who presents with cellulitis of her lower extremities.  She is had recurrent episodes of cellulitis of her lower extremities for the last several months.  She has large wounds on her lower extremities.  She is been getting care in [place] and she feels like she is not getting good treatment.  She feels like she needs some wound debridement which she has not had done there and has been unable to get into other wound care clinics.  She is having some ongoing drainage redness and warmth to the legs.  No known fevers.  She was trying to go see Dr. Orvan Falconer today with infectious disease but did not have an appointment.  She had a friend walk into the office and was told to come to the emergency department and when she is admitted, Dr. Orvan Falconer can see her.  She was under the impression that she would be able to get an infectious disease consult and debridement today.     Past Medical History:  Diagnosis Date  . Atrial fibrillation (HCC)   . CHF (congestive heart failure) (HCC)   . Chronic pain   . COPD (chronic obstructive pulmonary disease) (HCC)   . 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 DVT (deep venous thrombosis) (HCC)    history of, RLE  . Hypertension   . RA (rheumatoid arthritis) [facility])     Patient Active Problem List   Diagnosis Date Noted  . Venous stasis ulcers (HCC) 10/13/2017  . Rheumatoid arthritis (HCC) 10/13/2017  . Obesity 10/13/2017  . Degenerative disc disease, cervical 10/13/2017  . Lupus (HCC) 10/13/2017    Past Surgical History:  Procedure Laterality Date  . ABDOMINAL HYSTERECTOMY   1985  . cervical disectomy  04/10/2008   4 vertebrae  . choleycystectomy  1986  . knee replacement Bilateral 1971, 1972     OB History   None      Home Medications    Prior to Admission medications   Medication Sig Start Date End Date Taking? Authorizing Provider  celecoxib (CELEBREX) 200 MG capsule Celebrex 200 mg capsule  Take 1 capsule twice a day by oral route as directed.   Yes [provider]  hydroxychloroquine (PLAQUENIL) 200 MG tablet Take 200 mg by mouth 2 (two) times daily.  03/19/15  Yes [provider]  methimazole (TAPAZOLE) 5 MG tablet methimazole 5 mg tablet  TAKE 1 1/2 TABLETS BY MOUTH ONCE A DAY   Yes [provider]  methotrexate 2.5 MG tablet methotrexate sodium 2.5 mg tablet  TAKE 8 TABLETs (2.5 MG) BY ORAL ROUTE ONCE WEEKLY per Dr Octaviano Glow   Yes [provider]  metolazone (ZAROXOLYN) 5 MG tablet metolazone 5 mg tablet  Take 1 tablet every day by oral route as needed.   Yes [provider]  metoprolol tartrate (LOPRESSOR) 25 MG tablet Take 25 mg by mouth 2 (two) times daily.  03/19/15  Yes [provider]  Multiple Vitamins-Minerals (MULTIVITAMIN ADULT EXTRA C PO) multivitamin  OTC   Yes [provider]  oxyCODONE (ROXICODONE) 15 MG immediate release tablet Take  15 mg by mouth every 6 (six) hours as needed for pain.    Yes [provider]  Oxycodone HCl 10 MG TABS Take 10 mg by mouth daily as needed (severe pain).  04/01/15  Yes [provider]  B Complex-C (B-COMPLEX WITH VITAMIN C) tablet Take 1 tablet by mouth daily.    [provider]  cholecalciferol (VITAMIN D) 1000 UNITS tablet Take 1,000 Units by mouth 2 (two) times daily before a meal.    [provider]  clindamycin (CLEOCIN) 300 MG capsule  09/30/17   [provider]  furosemide (LASIX) 40 MG tablet Take 40 mg by mouth as needed for fluid.     [provider]  halobetasol (ULTRAVATE) 0.05 %  ointment halobetasol propionate 0.05 % topical ointment    [provider]  omeprazole (PRILOSEC) 20 MG capsule Take 20 mg by mouth daily.    [provider]  predniSONE (DELTASONE) 10 MG tablet prednisone 10 mg tablet    [provider]  predniSONE (DELTASONE) 5 MG tablet 1 tablet. 03/21/15   [provider]  warfarin (COUMADIN) 5 MG tablet 7.5 mg. 03/05/15   [provider]    Family History Family History  Problem Relation Age of Onset  . Stroke Daughter     Social History Social History   Tobacco Use  . Smoking status: Former Smoker    Years: 44.00    Last attempt to quit: 03/26/2008    Years since quitting: 9.9  . Smokeless tobacco: Never Used  Substance Use Topics  . Alcohol use: No    Alcohol/week: 0.0 standard drinks  . Drug use: No     Allergies   Latex; Soy allergy; and Levofloxacin   Review of Systems Review of Systems  Constitutional: Negative for chills, diaphoresis, fatigue and fever.  HENT: Negative for congestion, rhinorrhea and sneezing.   Eyes: Negative.   Respiratory: Negative for cough, chest tightness and shortness of breath.   Cardiovascular: Positive for leg swelling. Negative for chest pain.  Gastrointestinal: Negative for abdominal pain, blood in stool, diarrhea, nausea and vomiting.  Genitourinary: Negative for difficulty urinating, flank pain, frequency and hematuria.  Musculoskeletal: Negative for arthralgias and back pain.  Skin: Positive for color change and wound. Negative for rash.  Neurological: Negative for dizziness, speech difficulty, weakness, numbness and headaches.     Physical Exam Updated Vital Signs BP 122/71   Pulse (!) 125   Temp 97.9 F (36.6 C) (Oral)   Resp 15   SpO2 97%   Physical Exam  Constitutional: She is oriented to person, place, and time. She appears well-developed and well-nourished.  HENT:  Head: Normocephalic and atraumatic.  Eyes: Pupils are equal, round,  and reactive to light.  Neck: Normal range of motion. Neck supple.  Cardiovascular: Normal rate, regular rhythm and normal heart sounds.  Pulmonary/Chest: Effort normal and breath sounds normal. No respiratory distress. She has no wheezes. She has no rales. She exhibits no tenderness.  Abdominal: Soft. Bowel sounds are normal. There is no tenderness. There is no rebound and no guarding.  Musculoskeletal: Normal range of motion. She exhibits edema.  Patient has marked edema of the lower extremities bilaterally large wounds on both anterior lower legs, primarily on the right.  There is surrounding warmth and erythema up to the knees.  There is foul-smelling exudative material on the wounds.  Lymphadenopathy:    She has no cervical adenopathy.  Neurological: She is alert and oriented to person,  place, and time.  Skin: Skin is warm and dry. No rash noted.  Psychiatric: She has a normal mood and affect.     ED Treatments / Results  Labs (all labs ordered are listed, but only abnormal results are displayed) Labs Reviewed  COMPREHENSIVE METABOLIC PANEL - Abnormal; Notable for the following components:      Result Value   Potassium 3.2 (*)    Chloride 89 (*)    CO2 34 (*)    Glucose, Bld 100 (*)    Creatinine, Ser 1.18 (*)    Albumin 2.9 (*)    GFR calc non Af Amer 44 (*)    GFR calc Af Amer 51 (*)    All other components within normal limits  CBC WITH DIFFERENTIAL/PLATELET - Abnormal; Notable for the following components:   RBC 3.74 (*)    Hemoglobin 9.6 (*)    HCT 32.7 (*)    MCH 25.7 (*)    MCHC 29.4 (*)    All other components within normal limits  I-STAT CG4 LACTIC ACID, ED - Abnormal; Notable for the following components:   Lactic Acid, Venous 2.40 (*)    All other components within normal limits  CULTURE, BLOOD (ROUTINE X 2)  CULTURE, BLOOD (ROUTINE X 2)  PROTIME-INR  I-STAT CG4 LACTIC ACID, ED    EKG EKG Interpretation  Date/Time:  Tuesday February 28 2018 14:45:26  EDT Ventricular Rate:  118 PR Interval:    QRS Duration: 91 QT Interval:  326 QTC Calculation: 457 R Axis:   27 Text Interpretation:  Atrial fibrillation Repol abnrm suggests ischemia, diffuse leads Artifact in lead(s) I II aVR aVL No old tracing to compare Confirmed by Rolan Bucco [phone]) on 02/28/2018 2:56:25 PM   Radiology No results found.  Procedures Procedures (including critical care time)  Medications Ordered in ED Medications  clindamycin (CLEOCIN) IVPB 600 mg (has no administration in time range)  sodium chloride 0.9 % bolus 500 mL (has no administration in time range)     Initial Impression / Assessment and Plan / ED Course  I have reviewed the triage vital signs and the nursing notes.  Pertinent labs & imaging results that were available during my care of the patient were reviewed by me and considered in my medical decision making (see chart for details).     Patient is a 77 year old female who presents with worsening skin wounds to her lower legs.  She has warmth and erythema up to the knees.  I feel like her bigger issue is that she needs good wound care.  She is not febrile but she does have an elevated lactate.  She has some mild tachycardia.  No hypotension.  She was started on IV antibiotics.  She was given a small fluid bolus.  I spoke with the internal medicine teaching service resident who will admit the patient for further treatment.  Final Clinical Impressions(s) / ED Diagnoses   Final diagnoses:  Cellulitis of lower extremity, unspecified laterality    ED Discharge Orders    None       Rolan Bucco, MD 02/28/18 1455    Rolan Bucco, MD 02/28/18 1456

## 2018-02-28 NOTE — Plan of Care (Signed)
  Problem: Coping: Goal: Level of anxiety will decrease Outcome: Progressing   Problem: Pain Managment: Goal: General experience of comfort will improve Outcome: Progressing   

## 2018-02-28 NOTE — H&P (Signed)
Date: 02/28/2018               Patient Name:  Shirley Waters MRN: [id number]  DOB: Jun 22, 1941 Age / Sex: 77 y.o., female   PCP: Renaldo Harrison, DO         Medical Service: Internal Medicine Teaching Service         Attending Physician: Dr. Doneen Poisson, MD    First Contact: Dr. Erich Montane Pager: [phone]  Second Contact: Dr. Levora Dredge Pager: [phone]       After Hours (After 5p/  First Contact Pager: [phone]  weekends / holidays): Second Contact Pager: [phone]   Chief Complaint: Leg infection   History of Present Illness: Shirley Waters is a 77 y.o female with A-fib on warfarin and venous insufficiency who presented to the ED with chronic LE wounds that have been getting progressively worse. Her wounds started with the left leg in late March 2018. She woke up with pain and redness in the left leg and presented to the hospital. While in the hosital it spread to her right leg. She developed a blister that quickly unroofed and became an ulcer the size of an egg. She is frustrated because she has been hospitalized multiple times, been to multiple rehabs, and on multiple rounds of IV antibiotics without improvement. She would like to see an infectious disease doctor. In 2007 she was seen by wound care for ulcers on her left LE but states that it was different and due to MRSA. She states that she was told she had stasis dermatitis in 2007.   Per chart review she has been dealing with this issue on and off for >1 year. She saw Dr. Orvan Falconer in 10/2017, and it was felt she had venous insufficiency with probable superficial infection that was improving. He recommended continued wound care. Prior to this she had wound culturs on 09/02/2017 that grew MRSA, Proteus mirabilis and Klebsiella oxytoca. She has been on Bactrim, Clindamycin, and Ceftriaxone.   She endorses pain but denies fevers, chills, N/V, headaches, diarrhea, dysuria. In addition she is experiencing constipation and urge incontinence,  both of which are chronic.    Pertinent PMHx that places her at increased risk for infection includes RA and SLE, which she is on Celebrex, prednisone, methotrexate, and plaquenil.  Meds:  Current Meds  Medication Sig  . Bismuth Tribromoph-Petrolatum (XEROFORM PETROLAT GAUZE 1"X8" EX) Apply 1 application topically 3 (three) times daily.  . celecoxib (CELEBREX) 200 MG capsule Take by mouth 2 (two) times daily.   . Cholecalciferol (VITAMIN D PO) Take 5,000 Units by mouth daily.  . furosemide (LASIX) 40 MG tablet Take 40 mg by mouth as needed for fluid.   . hydroxychloroquine (PLAQUENIL) 200 MG tablet Take 200 mg by mouth 2 (two) times daily.   . methimazole (TAPAZOLE) 5 MG tablet Take 7.5 mg by mouth daily.   . methotrexate 2.5 MG tablet Take 20 mg by mouth once a week. Taking 8 tablets (2.5mg ) weekly  . metolazone (ZAROXOLYN) 5 MG tablet Take 5 mg by mouth daily as needed (for fluid. takes with furosemide).   . metoprolol tartrate (LOPRESSOR) 25 MG tablet Take 25 mg by mouth 2 (two) times daily.   . Multiple Vitamins-Minerals (MULTIVITAMIN ADULT EXTRA C PO) Take 1 tablet by mouth daily.   Marland Kitchen oxyCODONE (ROXICODONE) 15 MG immediate release tablet Take 15 mg by mouth every 6 (six) hours as needed for pain.   . Oxycodone HCl 10 MG TABS Take  10 mg by mouth daily as needed (severe pain).   . predniSONE (DELTASONE) 5 MG tablet Take 5-10 tablets by mouth See admin instructions. Taking one 5mg  or one 10mg  tablet daily depending on arthritis symptoms  . Propylene Glycol (SYSTANE BALANCE) 0.6 % SOLN Apply 1 drop to eye as needed (dry eyes).  . warfarin (COUMADIN) 5 MG tablet Take 5 mg by mouth one time only at 6 PM.    Allergies: Allergies as of 02/28/2018 - Review Complete 02/28/2018  Allergen Reaction Noted  . Latex Rash 04/07/2015  . Levofloxacin Palpitations 10/12/2017   Past Medical History:  Diagnosis Date  . Atrial fibrillation (HCC)   . CHF (congestive heart failure) (HCC)   . Chronic  pain   . COPD (chronic obstructive pulmonary disease) (HCC)   . 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 DVT (deep venous thrombosis) (HCC)    history of, RLE  . Hypertension   . RA (rheumatoid arthritis) (HCC)    Family History:  Family history of CAD. Denies other family history including DM, PAD.   Social History:  Previous smoker. Denies EtOH use.  Review of Systems: A complete ROS was negative except as per HPI.   Physical Exam: Blood pressure 103/70, pulse (!) 114, temperature 97.9 F (36.6 C), temperature source Oral, resp. rate (!) 23, SpO2 100 %.  General: Obese female in no acute distress HENT: Normocephalic, atraumatic, moist mucus membranes Pulm: Good air movement with no wheezing or crackles  CV: Irregularly irregular rhythm without obvious murmurs or rubs Abdomen: Active bowel sounds, soft, non-distended, no tenderness to palpation  Extremities: Pulses palpable in all extremities, LE edema with lichenification of the LEs and open wounds with purulent and granular wound beds (photos below) Skin: Warm and dry  Neuro: Alert and oriented x 3  EKG: personally reviewed: my interpretation is atrial fibrillation        Assessment & Plan by Problem: Active Problems:   Venous stasis ulcer (HCC)  Shirley Waters is a 77 y.o female with A-fib on warfarin and venous insufficiency who presented to the ED with chronic LE wounds that have been getting progressively worse. The lichenification and wounds are consistent with ulcers 2/2 venous insufficiency.   LE ulcers 2/2 Venous Insufficiency. On exam the patient is afebrile without systemic signs of infection; however the wounds do have beds of purulent discharge and granulation tissue.I discussed the case with ID, who will formally see her tomorrow but recommended wound care. In the meantime we will start the patient on oral doxycycline.  - Follow-up BC drawn by ED - Doxycycline 100 mg BID for 7 days - Pain control with  Oxycodone 10 mg Q6 hours PRN - Bowel regimen with miralax and senokot  - Wound care consult   Atrial fibrillation.  - Poorly rate controlled on Metoprolol 25 mg BID, may need escalation  - Continue anticoagulation with warfarin   RA / SLE - Continue Plaquenil and Prednisone   Hyperthyroidism  - Continuing Methimazole   ? CHF - No signs or symptoms of an acute exacerbation  - Holding PRN furosemide and metolazone  - Continuing metoprolol   Diet: Heart healthy VTE ppx: Warfarin  CODE STATUS: Full   Dispo: Admit patient to Observation with expected length of stay less than 2 midnights.  Signed: Buel Ream, MD 02/28/2018, 4:25 PM  Pager: [phone]

## 2018-03-01 DIAGNOSIS — Z87891 Personal history of nicotine dependence: Secondary | ICD-10-CM | POA: Diagnosis not present

## 2018-03-01 DIAGNOSIS — I10 Essential (primary) hypertension: Secondary | ICD-10-CM | POA: Diagnosis present

## 2018-03-01 DIAGNOSIS — I83009 Varicose veins of unspecified lower extremity with ulcer of unspecified site: Secondary | ICD-10-CM | POA: Diagnosis not present

## 2018-03-01 DIAGNOSIS — I482 Chronic atrial fibrillation: Secondary | ICD-10-CM

## 2018-03-01 DIAGNOSIS — I872 Venous insufficiency (chronic) (peripheral): Secondary | ICD-10-CM | POA: Diagnosis present

## 2018-03-01 DIAGNOSIS — D649 Anemia, unspecified: Secondary | ICD-10-CM | POA: Diagnosis present

## 2018-03-01 DIAGNOSIS — L97919 Non-pressure chronic ulcer of unspecified part of right lower leg with unspecified severity: Secondary | ICD-10-CM | POA: Diagnosis not present

## 2018-03-01 DIAGNOSIS — M1990 Unspecified osteoarthritis, unspecified site: Secondary | ICD-10-CM | POA: Diagnosis present

## 2018-03-01 DIAGNOSIS — Z881 Allergy status to other antibiotic agents status: Secondary | ICD-10-CM | POA: Diagnosis not present

## 2018-03-01 DIAGNOSIS — E669 Obesity, unspecified: Secondary | ICD-10-CM

## 2018-03-01 DIAGNOSIS — I70242 Atherosclerosis of native arteries of left leg with ulceration of calf: Secondary | ICD-10-CM | POA: Diagnosis not present

## 2018-03-01 DIAGNOSIS — Z79899 Other long term (current) drug therapy: Secondary | ICD-10-CM

## 2018-03-01 DIAGNOSIS — Z7901 Long term (current) use of anticoagulants: Secondary | ICD-10-CM | POA: Diagnosis not present

## 2018-03-01 DIAGNOSIS — B9562 Methicillin resistant Staphylococcus aureus infection as the cause of diseases classified elsewhere: Secondary | ICD-10-CM

## 2018-03-01 DIAGNOSIS — B965 Pseudomonas (aeruginosa) (mallei) (pseudomallei) as the cause of diseases classified elsewhere: Secondary | ICD-10-CM

## 2018-03-01 DIAGNOSIS — I4821 Permanent atrial fibrillation: Secondary | ICD-10-CM | POA: Diagnosis present

## 2018-03-01 DIAGNOSIS — M069 Rheumatoid arthritis, unspecified: Secondary | ICD-10-CM | POA: Diagnosis present

## 2018-03-01 DIAGNOSIS — L97929 Non-pressure chronic ulcer of unspecified part of left lower leg with unspecified severity: Secondary | ICD-10-CM | POA: Diagnosis not present

## 2018-03-01 DIAGNOSIS — L97828 Non-pressure chronic ulcer of other part of left lower leg with other specified severity: Secondary | ICD-10-CM | POA: Diagnosis present

## 2018-03-01 DIAGNOSIS — Z91040 Latex allergy status: Secondary | ICD-10-CM | POA: Diagnosis not present

## 2018-03-01 DIAGNOSIS — Z7952 Long term (current) use of systemic steroids: Secondary | ICD-10-CM

## 2018-03-01 DIAGNOSIS — I70232 Atherosclerosis of native arteries of right leg with ulceration of calf: Secondary | ICD-10-CM | POA: Diagnosis not present

## 2018-03-01 DIAGNOSIS — L03119 Cellulitis of unspecified part of limb: Secondary | ICD-10-CM | POA: Diagnosis present

## 2018-03-01 DIAGNOSIS — Z96653 Presence of artificial knee joint, bilateral: Secondary | ICD-10-CM

## 2018-03-01 DIAGNOSIS — M329 Systemic lupus erythematosus, unspecified: Secondary | ICD-10-CM | POA: Diagnosis present

## 2018-03-01 DIAGNOSIS — E0590 Thyrotoxicosis, unspecified without thyrotoxic crisis or storm: Secondary | ICD-10-CM

## 2018-03-01 DIAGNOSIS — Z8614 Personal history of Methicillin resistant Staphylococcus aureus infection: Secondary | ICD-10-CM | POA: Diagnosis not present

## 2018-03-01 DIAGNOSIS — K5900 Constipation, unspecified: Secondary | ICD-10-CM | POA: Diagnosis present

## 2018-03-01 DIAGNOSIS — R791 Abnormal coagulation profile: Secondary | ICD-10-CM | POA: Diagnosis not present

## 2018-03-01 DIAGNOSIS — Z6841 Body Mass Index (BMI) 40.0 and over, adult: Secondary | ICD-10-CM | POA: Diagnosis not present

## 2018-03-01 DIAGNOSIS — L03115 Cellulitis of right lower limb: Secondary | ICD-10-CM | POA: Diagnosis present

## 2018-03-01 DIAGNOSIS — L97909 Non-pressure chronic ulcer of unspecified part of unspecified lower leg with unspecified severity: Secondary | ICD-10-CM | POA: Insufficient documentation

## 2018-03-01 DIAGNOSIS — L03116 Cellulitis of left lower limb: Secondary | ICD-10-CM | POA: Diagnosis present

## 2018-03-01 DIAGNOSIS — Z86718 Personal history of other venous thrombosis and embolism: Secondary | ICD-10-CM | POA: Diagnosis not present

## 2018-03-01 DIAGNOSIS — Z8249 Family history of ischemic heart disease and other diseases of the circulatory system: Secondary | ICD-10-CM | POA: Diagnosis not present

## 2018-03-01 DIAGNOSIS — N3941 Urge incontinence: Secondary | ICD-10-CM | POA: Diagnosis present

## 2018-03-01 DIAGNOSIS — J449 Chronic obstructive pulmonary disease, unspecified: Secondary | ICD-10-CM | POA: Diagnosis present

## 2018-03-01 DIAGNOSIS — I83209 Varicose veins of unspecified lower extremity with both ulcer of unspecified site and inflammation: Secondary | ICD-10-CM | POA: Diagnosis not present

## 2018-03-01 DIAGNOSIS — L97818 Non-pressure chronic ulcer of other part of right lower leg with other specified severity: Secondary | ICD-10-CM | POA: Diagnosis present

## 2018-03-01 DIAGNOSIS — Z823 Family history of stroke: Secondary | ICD-10-CM | POA: Diagnosis not present

## 2018-03-01 DIAGNOSIS — L089 Local infection of the skin and subcutaneous tissue, unspecified: Secondary | ICD-10-CM | POA: Diagnosis not present

## 2018-03-01 LAB — CBC
HEMATOCRIT: 31.5 % — AB (ref 36.0–46.0)
HEMOGLOBIN: 9.6 g/dL — AB (ref 12.0–15.0)
MCH: 26.3 pg (ref 26.0–34.0)
MCHC: 30.5 g/dL (ref 30.0–36.0)
MCV: 86.3 fL (ref 78.0–100.0)
Platelets: 303 10*3/uL (ref 150–400)
RBC: 3.65 MIL/uL — AB (ref 3.87–5.11)
RDW: 14.9 % (ref 11.5–15.5)
WBC: 7.1 10*3/uL (ref 4.0–10.5)

## 2018-03-01 LAB — PROTIME-INR
INR: 3.38
Prothrombin Time: 33.9 seconds — ABNORMAL HIGH (ref 11.4–15.2)

## 2018-03-01 LAB — LACTIC ACID, PLASMA
LACTIC ACID, VENOUS: 1.6 mmol/L (ref 0.5–1.9)
Lactic Acid, Venous: 1.3 mmol/L (ref 0.5–1.9)

## 2018-03-01 MED ORDER — SODIUM CHLORIDE 0.9 % IV SOLN
2.0000 g | INTRAVENOUS | Status: DC
  Administered 2018-03-01 – 2018-03-02 (×2): 2 g via INTRAVENOUS
  Filled 2018-03-01 (×3): qty 2

## 2018-03-01 MED ORDER — VANCOMYCIN HCL 10 G IV SOLR
1500.0000 mg | INTRAVENOUS | Status: DC
  Administered 2018-03-01 – 2018-03-02 (×2): 1500 mg via INTRAVENOUS
  Filled 2018-03-01 (×3): qty 1500

## 2018-03-01 MED ORDER — METOPROLOL TARTRATE 50 MG PO TABS
50.0000 mg | ORAL_TABLET | Freq: Two times a day (BID) | ORAL | Status: DC
  Administered 2018-03-02 – 2018-03-07 (×8): 50 mg via ORAL
  Filled 2018-03-01 (×11): qty 1

## 2018-03-01 MED ORDER — FUROSEMIDE 40 MG PO TABS
40.0000 mg | ORAL_TABLET | Freq: Every day | ORAL | Status: DC
  Administered 2018-03-01 – 2018-03-07 (×7): 40 mg via ORAL
  Filled 2018-03-01 (×7): qty 1

## 2018-03-01 NOTE — Progress Notes (Signed)
Internal Medicine Attending  Date: 03/01/2018  Patient name: Shirley Waters Medical record number: [id number] Date of birth: 11/25/1940 Age: 77 y.o. Gender: female  I saw and evaluated the patient. I reviewed the resident's note by Dr. Nedra Hai and I agree with the resident's findings and plans as documented in his progress note.  Please see my H&P dated 03/01/2018 for the specifics of my evaluation, assessment, and plan from earlier in the day.

## 2018-03-01 NOTE — Progress Notes (Addendum)
ANTICOAGULATION CONSULT NOTE  Pharmacy Consult for warfarin Indication: atrial fibrillation, hx DVT  Allergies  Allergen Reactions  . Latex Rash  . Levofloxacin Palpitations   Vital Signs: Temp: 98.2 F (36.8 C) (08/21 0411) Temp Source: Oral (08/21 0411) BP: 101/51 (08/21 0411) Pulse Rate: 117 (08/21 0411)  Labs: Recent Labs    02/28/18 1246 02/28/18 1528 03/01/18 0448  HGB 9.6*  --  9.6*  HCT 32.7*  --  31.5*  PLT 348  --  303  LABPROT  --  30.4* 33.9*  INR  --  2.94 3.38  CREATININE 1.18*  --   --     CrCl cannot be calculated (Unknown ideal weight.).   Medical History: Past Medical History:  Diagnosis Date  . Atrial fibrillation (HCC)   . CHF (congestive heart failure) (HCC)   . Chronic pain   . COPD (chronic obstructive pulmonary disease) (HCC)   . 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 DVT (deep venous thrombosis) (HCC)    history of, RLE  . Hypertension   . RA (rheumatoid arthritis) (HCC)     Medications:  Medications Prior to Admission  Medication Sig Dispense Refill Last Dose  . Bismuth Tribromoph-Petrolatum (XEROFORM PETROLAT GAUZE 1"X8" EX) Apply 1 application topically 3 (three) times daily.   02/27/2018 at Unknown time  . celecoxib (CELEBREX) 200 MG capsule Take by mouth 2 (two) times daily.    02/28/2018 at Unknown time  . Cholecalciferol (VITAMIN D PO) Take 5,000 Units by mouth daily.   02/27/2018 at Unknown time  . furosemide (LASIX) 40 MG tablet Take 40 mg by mouth as needed for fluid.    02/26/2018  . hydroxychloroquine (PLAQUENIL) 200 MG tablet Take 200 mg by mouth 2 (two) times daily.    02/28/2018 at Unknown time  . methimazole (TAPAZOLE) 5 MG tablet Take 7.5 mg by mouth daily.    02/27/2018 at Unknown time  . methotrexate 2.5 MG tablet Take 20 mg by mouth once a week. Taking 8 tablets (2.5mg ) weekly   02/26/2018  . metolazone (ZAROXOLYN) 5 MG tablet Take 5 mg by mouth daily as needed (for fluid. takes with furosemide).    02/26/2018  .  metoprolol tartrate (LOPRESSOR) 25 MG tablet Take 25 mg by mouth 2 (two) times daily.    02/28/2018 at 0800  . Multiple Vitamins-Minerals (MULTIVITAMIN ADULT EXTRA C PO) Take 1 tablet by mouth daily.    Past Week at Unknown time  . oxyCODONE (ROXICODONE) 15 MG immediate release tablet Take 15 mg by mouth every 6 (six) hours as needed for pain.    02/28/2018 at 01000  . Oxycodone HCl 10 MG TABS Take 10 mg by mouth daily as needed (severe pain).    02/27/2018 at Unknown time  . predniSONE (DELTASONE) 5 MG tablet Take 5-10 tablets by mouth See admin instructions. Taking one 5mg  or one 10mg  tablet daily depending on arthritis symptoms   02/27/2018 at Unknown time  . Propylene Glycol (SYSTANE BALANCE) 0.6 % SOLN Apply 1 drop to eye as needed (dry eyes).   Past Week at Unknown time  . warfarin (COUMADIN) 5 MG tablet Take 5 mg by mouth one time only at 6 PM.    02/27/2018 at Unknown time  . predniSONE (DELTASONE) 10 MG tablet 5-10 mg daily with breakfast.    Not Taking at Unknown time    Assessment:  Shirley Waters on warfarin for atrial fibrillation. INR on admit 2.94, with last dose PTA  on 8/19. Warfarin 5mg  daily reported as PTA warfarin regimen. Patient here from and there are no anticoagulation notes found in Care Everywhere.  INR supratherapeutic at 3.38. H/H and plts stable. No reports of bleeding.   Goal of Therapy:  INR 2-3 Monitor platelets by anticoagulation protocol: Yes   Plan:  Hold warfarin tonight Daily CBC/ INR  Monitor for s/sx of bleeding  [place], PharmD PGY1 Pharmacy Resident Phone [phone] 03/01/2018 6:04 AM   I discussed / reviewed the pharmacy note by Dr. 03/03/2018 and I agree with the resident's findings and plans as documented.  Stormy Fabian, Pharm.D., BCPS Clinical Pharmacist 03/01/2018 2:28 PM

## 2018-03-01 NOTE — H&P (Signed)
Internal Medicine Attending Admission Note Date: 03/01/2018  Patient name: Shirley Waters Medical record number: [id number] Date of birth: 1940-09-08 Age: 77 y.o. Gender: female  I saw and evaluated the patient. I reviewed the resident's note and I agree with the resident's findings and plan as documented in the resident's note.  Chief Complaint(s): Worsening leg ulcers right > left  History - key components related to admission:  Shirley Waters is a 77 year old woman with a history of rheumatoid arthritis and systemic lupus erythematosus on chronic steroids, hydroxychloroquine, and methotrexate, chronic venous insufficiency with recurrent venous insufficiency ulcers, permanent atrial fibrillation, and obesity who presents with worsening leg ulcers right > left.  She has at least a ten-year history of chronic venous stasis ulcers that would come and go depending on therapy. As early as 2007 she was involved in a study at [facility] where [employer] were placed and her ulcers healed. Over the last year and a half she has had recurrent issues with lower extremity ulcers, but since April her ulcers have worsened, particularly in the right lower extremity. She apparently is receiving wound care in [place], [place] but has been unhappy with the results and therefore drove to [facility] to see Dr. Orvan Falconer.  He did not have availability in the clinic to see her and she was referred to the emergency department where she was admitted to the internal medicine teaching service for further evaluation and care of her presumed venous stasis ulcers.  Physical Exam - key components related to admission:  Vitals:   02/28/18 2013 02/28/18 2249 03/01/18 0411 03/01/18 1100  BP: (!) 69/30 104/67 (!) 101/51   Pulse: 100 (!) 103 (!) 117   Resp: 18  18   Temp:   98.2 F (36.8 C)   TempSrc:   Oral   SpO2: 100%  95%   Weight:    106 kg  Height:    5\' 4"  (1.626 m)   Gen.: Well-developed, well-nourished, woman sitting  comfortably in the recliner with her legs elevated in no acute distress. Lower extremities: Enlarged anterior right sided ulceration with thick exudate on the base and punched out borders. The left lower extremity has punched out ulcers also with fibrinous exudate that are not nearly as large as the right anterior lesion.  She has chronic venous stasis changes in the skin below the knee bilaterally. That said, she has little if any edema and stigmata of prior considerable edema that has since resolved including folds within the venous stasis change. I was unable to feel the dorsalis pedis pulses and would have difficulty feeling the posterior tibialis pulses given the lichefication related to the chronic skin changes.  Lab results:  Basic Metabolic Panel: Recent Labs    02/28/18 1246  NA 137  K 3.2*  CL 89*  CO2 34*  GLUCOSE 100*  BUN 9  CREATININE 1.18*  CALCIUM 9.3   Liver Function Tests: Recent Labs    02/28/18 1246  AST 20  ALT 9  ALKPHOS 65  BILITOT 0.7  PROT 7.6  ALBUMIN 2.9*   CBC: Recent Labs    02/28/18 1246 03/01/18 0448  WBC 6.0 7.1  NEUTROABS 3.2  --   HGB 9.6* 9.6*  HCT 32.7* 31.5*  MCV 87.4 86.3  PLT 348 303   Coagulation: Recent Labs    02/28/18 1528 03/01/18 0448  INR 2.94 3.38   Misc. Labs:  Lactic acid 2.40 -> 2.59 -> 1.3 -> 1.6 Blood cultures pending  Other results:  EKG: Personally reviewed. Atrial fibrillation at 118 bpm, normal axis, normal intervals, no significant Q waves, no LVH by voltage, early R wave progression, no ST segment changes, T wave inversion in a strain pattern in the lateral leads. No comparisons immediately available.  Assessment & Plan by Problem:  Shirley Waters is a 77 year old woman with a history of rheumatoid arthritis and systemic lupus erythematosus on chronic steroids, hydroxychloroquine, and methotrexate, chronic venous insufficiency with recurrent venous insufficiency ulcers, permanent atrial fibrillation, and  obesity who presents with worsening leg ulcers right > left.  She has chronic changes to the lower extremities suggesting prolonged history of venous stasis, and now with resultant venous stasis ulcerations. It is possible she has arterial insufficiency, although she states a recent evaluation in [place] was unremarkable for arterial disease. Her history is much more suggestive of chronic venous insufficiency ulcers. Although there is infection that is impeding healing, there are several other aspects that also impeding healing. I'm concerned she may not be as meticulous as needed to keep the edema from her lower extremities to help promote healing. In addition, she is on chronic prednisone which undoubtedly inhibits her ability to heal normally. She has chronic rheumatologic disease with likely dermal inflammation in and of itself that inhibits wound healing. I am concerned that the best we can do is provide meticulous wound care, educate her on the importance of avoiding any edema in the lower extremities, help her with self management through compression stockings and or Unna boots once the lesions are improved, treating any underlying infection, and weaning the prednisone to the lowest possible dose.  1) Chronic venous stasis ulcerations right > left: We appreciate infectious disease's assessment and antibiotic recommendations. We have consulted vascular surgery who is contacting [facility] for the most recent arterial assessment. Once they have this information and have a chance to assess the patient, they may make further recommendations, which I suspect will mainly center around possible wound debridement and meticulous wound care. We will restart her diuretics to assure she does not have recurrence of significant edema that would hinder our attempts at healing these wounds. As it sounds like she would prefer to get her wound care in [place], we will try to plug her into a local wound clinic for ongoing  care as this will be critical for any chance to heal these lesions.  2) Rheumatoid arthritis/SLE: We will encourage her to return to her local rheumatologist and ask if there is any way in which the prednisone can be slowly weaned, if not off eventually. This is important as the prednisone is inhibiting wound healing.  3) Disposition: I am hopeful that with debridement, if appropriate, education on wound care, and antibiotics that she will quickly have improvement in her lower extremity wounds and easily transition to the outpatient setting with oral antibiotics and further wound care. I am hopeful this can be achieved within the next 24-48 hours.

## 2018-03-01 NOTE — Consult Note (Signed)
WOC Nurse wound consult note Evaluation of patient's legs completed in 5N08.  No family present.  A team of three physicians participated.   Reason for Consult: Bilateral lower leg wounds Wound type: The etiology is unclear to me at this time, although she has been known to have venous insufficiency for a number of years.  I cannot palpate a dorsalis pedis pulse in either foot.  The wounds have a punched out appearance, and many of the wound edges are blackened.  The patient states the swelling in her legs has improved.  Today they remain edematous.  There is erythema to just below the knees bilaterally.  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 Measurement: Almost the entire right lower leg, circumferentially, is affected by wounds that are heavy with necrosis and this leg is tender to light touch.  The existing Xeroform and kerlex wrap is heavily saturated with yellow drainage.  I understand this dressing was placed this morning.  The left anterior lower leg has one large wound that is 100% covered in yellow slough with two small wounds in the early phases just below this main one.  There is a small amount of yellow drainage to the xeroform and kerlex wrap to this leg.  I highly recommend getting an arterial flow study to the legs, as well as a surgical consult and for infectious diseases to be involved in her care.  I have redressed both lower legs with Xeroform gauzes and kerlex to facilitate evaluation by additional services.  The plan is for the WOC service to follow up tomorrow for topical wound care orders after infectious diseases and surgery has an opportunity to evaluate. Monitor the wound area(s) for worsening of condition such as: Signs/symptoms of infection,  Increase in size,  Development of or worsening of odor, Development of pain, or increased pain at the affected locations.  Notify the medical team if any of these develop.  Thank you for  the consult.  Discussed plan of care with the patient and bedside nurse.  Helmut Muster, RN, MSN, CWOCN, CNS-BC, pager [phone]

## 2018-03-01 NOTE — Progress Notes (Signed)
Subjective:  Shirley Waters is a 77 yo F w/ PMH of SLE, RA, Chronic venous insufficeincy and DJD s/p bilateral knee replacements presenting to the hospital for bilateral chronic ulcers on admit day 1  Shirley Waters was examined and evaluated at bedside this AM with wound care present. She states her legs, especially the swelling appear much improved today. Still endorsing burning pain around sites of her ulcers. When asked about history of peripheral vascular disease, she states she underwent ABI during her recent hospitalization at [facility] and was found to have no arterial disease. She describes frustrations with uncertainty in why her venous stasis ulcers are not improving. She states she had used compression stockings in the past for since 2007 with no complications until earlier this year. Denies any F/N/V/D/C  Objective: Vital signs in last 24 hours: Vitals:   02/28/18 2013 02/28/18 2249 03/01/18 0411 03/01/18 1100  BP: (!) 69/30 104/67 (!) 101/51   Pulse: 100 (!) 103 (!) 117   Resp: 18  18   Temp:   98.2 F (36.8 C)   TempSrc:   Oral   SpO2: 100%  95%   Weight:    106 kg  Height:    5\' 4"  (1.626 m)   Physical Exam  Constitutional: She is oriented to person, place, and time and well-developed, well-nourished, and in no distress. No distress.  Anxious appearing female  HENT:  Head: Normocephalic and atraumatic.  Mouth/Throat: Oropharynx is clear and moist. No oropharyngeal exudate.  Eyes: Pupils are equal, round, and reactive to light. Conjunctivae and EOM are normal. No scleral icterus.  Neck: Normal range of motion. Neck supple. No JVD present. No thyromegaly present.  Cardiovascular: Normal rate, regular rhythm and normal heart sounds.  Pedal pulses difficult to assess  Pulmonary/Chest: Effort normal and breath sounds normal. No respiratory distress. She has no wheezes.  Abdominal: Soft. Bowel sounds are normal. She exhibits no distension. There is no tenderness. There is no  guarding.  Musculoskeletal: Normal range of motion. She exhibits edema and tenderness.  Neurological: She is alert and oriented to person, place, and time. No cranial nerve deficit. GCS score is 15.  Skin: Skin is warm and dry. She is not diaphoretic.  Multiple lower extremity ulcers with surrounding edema. Looks unchanged from admission. Refer to H&P for pictures   Assessment/Plan:  Active Problems:   Venous stasis ulcers (HCC)   Normocytic anemia   DJD (degenerative joint disease)   S/P total knee arthroplasty, bilateral   History of DVT of lower extremity  Shirley Waters is a 77 yo F w/ PMH of SLE, RA, Chronic venous insufficeincy and DJD s/p bilateral knee replacements presenting to the hospital for bilateral chronic ulcers. She came to [facility] specifically to see Dr.Campbell as she felt she was not getting appropriate wound care treatment at her rehab center in [facility]. She ended up coming to the ED after missing her appointment. She has no signs of systemic illness due to her ulcers and her ulcers show some healing granulation tissue. She will be evaluated by vascular surgery and infectious disease for next steps.   Lower extremity ulcers 2/2 venous stasis vs arterial insufficiency Known Venous stasis since 2007. Treated with compression stockings. Started developing ulcers recently. Left sided ulcer in 09/2017 requiring IV abx and admission. Right side ulcer developed during SNF post discharge and has been enlarging progressively despite wound care at her SNF. Her ulcers may have delayed healing due to her prednisone for her RA. We will evaluate  for other sources of delayed wound healing, such as arterial insufficiency during this admission if we cannot find records of prior testing. - F/u Blood Cultures - Stop Doxy, Start Vanc/Cefepime per ID - Appreciate ID recs - Oxycodone 10mg  q6 PRN for pain - Dressing changes per wound care - Vascular surgery consult per wound care recs - Will  attempt to access prior ABI results from [facility] - C/w Lasix 40mg  PO  A.fib  - Pharm consult for warfarin dosing - Rate control w/ metoprolol 50mg  BID  RA/SLE - c/w home meds: Plaquenil 200mg  Bid, Prednisone 5-10mg  daily,   Hyperthyroidism - C/w home med: methimazole 7.5mg   DVT prophx: Coumadin Diet: Regular Bowel: Senokot Code: FULL  Dispo: Anticipated discharge in approximately 1 day(s).   [facility], MD 03/01/2018, 11:53 AM Pager: [phone]

## 2018-03-01 NOTE — Progress Notes (Signed)
Pharmacy Antibiotic Note  Shirley Waters is a 77 y.o. female admitted on 02/28/2018 with lower extremity ulceration.  Pharmacy has been consulted for Vancomycin and Cefepime dosing.  She received two days of IV Doxycycline.  Cultures in [place] revealed MRSA and pseudomonas and ID suggested her antibiotic change.  Her creatinine is 1.1 with an estimated clearance ~ 78ml/min.  Lactic acid 2.4>>1.6, WBC wnl, currently afebrile with some mild tachycardia and mildly hypotensive.  Plan: - Will give Vancomycin 1.5gm every 24 hours - Begin Cefepime 2gm IV q24hr - Monitor renal function and adjust doses as appropriate - F/U blood cultures and clinical response - Check s/s levels as appropriate  Height: 5\' 4"  (162.6 cm)(Obtained from notes) Weight: 233 lb 9.6 oz (106 kg)(Obtained from notes) IBW/kg (Calculated) : 54.7  Temp (24hrs), Avg:98.2 F (36.8 C), Min:97.9 F (36.6 C), Max:98.6 F (37 C)  Recent Labs  Lab 02/28/18 1246 02/28/18 1302 02/28/18 1515 03/01/18 0448 03/01/18 0613 03/01/18 0847  WBC 6.0  --   --  7.1  --   --   CREATININE 1.18*  --   --   --   --   --   LATICACIDVEN  --  2.40* 2.59*  --  1.3 1.6    Estimated Creatinine Clearance: 48.2 mL/min (A) (by C-G formula based on SCr of 1.18 mg/dL (H)).    Allergies  Allergen Reactions  . Latex Rash  . Levofloxacin Palpitations   Antimicrobials this admission: 8/19 Doxycycline 8/21 8/21 Vanc >> 8/21 Cefepime >>  Dose adjustments this admission:  Microbiology results: 8/20 BCx:   9/20, PharmD., MS Clinical Pharmacist Pager:  [phone] Thank you for allowing pharmacy to be part of this patients care team. 03/01/2018 11:29 AM

## 2018-03-01 NOTE — Consult Note (Signed)
[facility] [place]  Patient name: Shirley Waters MRN: [id number] DOB: Dec 26, 1940 Sex: female   REQUESTING PROVIDER:    Hospital Service   REASON FOR CONSULT:    Leg ulcers  HISTORY OF PRESENT ILLNESS:   Shirley Waters is a 77 y.o. female, who was admitted on 02/28/2018 with chronic lower extremity wounds that have been getting progressively worse.  She states that she has a history of a venous stasis ulcer that healed with a Unna boot in the past.  More recently, she developed a wound in her left leg and March.  She states that she has been dealing with these issues on and off for a year.  She has been on antibiotics for cellulitis.  Wound cultures in February 2019 grew out MRSA Proteus and Klebsiella.  She does have compression stockings but has not been wearing them.  The patient suffers from atrial fibrillation and is on chronic anticoagulation with Coumadin.  She has COPD and is a former smoker.  She is medically managed for hypertension.  She does have a history of right leg DVT.  She is treated for rheumatoid arthritis.  PAST MEDICAL HISTORY    Past Medical History:  Diagnosis Date  . Atrial fibrillation (HCC)   . CHF (congestive heart failure) (HCC)   . Chronic pain   . COPD (chronic obstructive pulmonary disease) (HCC)   . 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 DVT (deep venous thrombosis) (HCC)    history of, RLE  . Hypertension   . RA (rheumatoid arthritis) (HCC)      FAMILY HISTORY   Family History  Problem Relation Age of Onset  . Stroke Daughter     SOCIAL HISTORY:   Social History   Socioeconomic History  . Marital status: Married    Spouse name: Not on file  . Number of children: Not on file  . Years of education: Not on file  . Highest education level: Not on file  Occupational History  . Not on file  Social Needs  . Financial resource strain: Not on file  . Food insecurity:    Worry:  Not on file    Inability: Not on file  . Transportation needs:    Medical: Not on file    Non-medical: Not on file  Tobacco Use  . Smoking status: Former Smoker    Years: 44.00    Last attempt to quit: 03/26/2008    Years since quitting: 9.9  . Smokeless tobacco: Never Used  Substance and Sexual Activity  . Alcohol use: No    Alcohol/week: 0.0 standard drinks  . Drug use: No  . Sexual activity: Not on file  Lifestyle  . Physical activity:    Days per week: Not on file    Minutes per session: Not on file  . Stress: Not on file  Relationships  . Social connections:    Talks on phone: Not on file    Gets together: Not on file    Attends religious service: Not on file    Active member of club or organization: Not on file    Attends meetings of clubs or organizations: Not on file    Relationship status: Not on file  . Intimate partner violence:    Fear of current or ex partner: Not on file    Emotionally abused: Not on file    Physically abused: Not on file    Forced sexual activity: Not on  file  Other Topics Concern  . Not on file  Social History Narrative  . Not on file    ALLERGIES:    Allergies  Allergen Reactions  . Latex Rash  . Levofloxacin Palpitations    CURRENT MEDICATIONS:    Current Facility-Administered Medications  Medication Dose Route Frequency Provider Last Rate Last Dose  . acetaminophen (TYLENOL) tablet 650 mg  650 mg Oral Q6H PRN Levora Dredge, MD       Or  . acetaminophen (TYLENOL) suppository 650 mg  650 mg Rectal Q6H PRN Helberg, Justin, MD      . ceFEPIme (MAXIPIME) 2 g in sodium chloride 0.9 % 100 mL IVPB  2 g Intravenous Q24H Doneen Poisson, MD   Stopped at 03/01/18 1443  . furosemide (LASIX) tablet 40 mg  40 mg Oral Daily Levora Dredge, MD   40 mg at 03/01/18 1300  . hydroxychloroquine (PLAQUENIL) tablet 200 mg  200 mg Oral BID Levora Dredge, MD   200 mg at 03/01/18 1030  . methimazole (TAPAZOLE) tablet 7.5 mg  7.5 mg Oral Daily  Helberg, Justin, MD   7.5 mg at 03/01/18 1031  . metoprolol tartrate (LOPRESSOR) tablet 50 mg  50 mg Oral BID Theotis Barrio, MD      . ondansetron [facility]) tablet 4 mg  4 mg Oral Q6H PRN Levora Dredge, MD       Or  . ondansetron (ZOFRAN) injection 4 mg  4 mg Intravenous Q6H PRN Helberg, Justin, MD      . oxyCODONE (Oxy IR/ROXICODONE) immediate release tablet 10 mg  10 mg Oral Q6H PRN Levora Dredge, MD   10 mg at 03/01/18 1750  . polyethylene glycol (MIRALAX / GLYCOLAX) packet 17 g  17 g Oral Daily PRN Helberg, Jill Alexanders, MD      . predniSONE (DELTASONE) tablet 5-10 mg  5-10 mg Oral Q breakfast Levora Dredge, MD   10 mg at 03/01/18 0759  . senna (SENOKOT) tablet 8.6 mg  1 tablet Oral BID Levora Dredge, MD   8.6 mg at 03/01/18 1030  . vancomycin (VANCOCIN) 1,500 mg in sodium chloride 0.9 % 500 mL IVPB  1,500 mg Intravenous Q24H Claudie Leach, RPH 250 mL/hr at 03/01/18 1600    . Warfarin - Pharmacist Dosing Inpatient   Does not apply q1800 Rudisill, Toniann Fail, [facility]        REVIEW OF SYSTEMS:   [X]  denotes positive finding, [ ]  denotes negative finding Cardiac  Comments:  Chest pain or chest pressure:    Shortness of breath upon exertion:    Waters of breath when lying flat:    Irregular heart rhythm: x       Vascular    Pain in calf, thigh, or hip brought on by ambulation:    Pain in feet at night that wakes you up from your sleep:     Blood clot in your veins:    Leg swelling:  x       Pulmonary    Oxygen at home:    Productive cough:     Wheezing:         Neurologic    Sudden weakness in arms or legs:     Sudden numbness in arms or legs:     Sudden onset of difficulty speaking or slurred speech:    Temporary loss of vision in one eye:     Problems with dizziness:         Gastrointestinal    Blood  in stool:      Vomited blood:         Genitourinary    Burning when urinating:     Blood in urine:        Psychiatric    Major depression:         Hematologic      Bleeding problems:    Problems with blood clotting too easily:        Skin    Rashes or ulcers: x       Constitutional    Fever or chills:     PHYSICAL EXAM:   Vitals:   02/28/18 2249 03/01/18 0411 03/01/18 1100 03/01/18 1313  BP: 104/67 (!) 101/51  101/73  Pulse: (!) 103 (!) 117  100  Resp:  18  16  Temp:  98.2 F (36.8 C)  99.4 F (37.4 C)  TempSrc:  Oral  Oral  SpO2:  95%  98%  Weight:   106 kg   Height:   5\' 4"  (1.626 m)     GENERAL: The patient is a well-nourished female, in no acute distress. The vital signs are documented above. CARDIAC: There is a regular rate and rhythm.  VASCULAR: Palpable pedal pulses, left greater than right.  Bilateral edema right greater than left PULMONARY: Nonlabored respirations ABDOMEN: Soft and non-tender with normal pitched bowel sounds.  MUSCULOSKELETAL: There are no major deformities or cyanosis. NEUROLOGIC: No focal weakness or paresthesias are detected. SKIN: Large multiple areas of ulceration with slough on the right leg.  A small area of ulceration on the left leg. PSYCHIATRIC: The patient has a normal affect.  STUDIES:   The patient says she has ABIs in the past which she reports are negative.  We are currently in the process of trying to obtain these results  ASSESSMENT and PLAN   Bilateral lower extremity venous stasis ulcers, right greater than left: It will be important to confirm normal arterial blood flow either by obtaining her outside ABIs or repeating them here.  I feel like I can palpate pedal pulses but with her edema it is somewhat difficult on the right.  Because of the slough on top of these ulcers, I do feel that she needs operative debridement.  If we can reverse her Coumadin, I will try to get this done on Friday.  I will also take biopsies of the wound for culture.  If her INR has not corrected, this will need to be done next week.  She will also need to go into compression with bilateral Unna boots.   Durene Cal, MD [facility] [phone] Pager [phone]

## 2018-03-01 NOTE — Consult Note (Signed)
[facility]    Date of Admission:  02/28/2018           Day 2 doxycycline       Reason for Consult: Infected venous stasis ulcers of both lower extremities    Referring Provider: Dr. Levora Dredge  Assessment: She now has bilateral lower leg ulcers that look much worse than when I saw her in April.  Much of the ulcers are covered in yellow slough.  I checked on cultures done while she was hospitalized in [place].  A right leg wound culture grew MRSA and a pansensitive pseudomonas aeruginosa.  I will switch her to IV vancomycin and cefepime.  I am hopeful that a short course of IV antibiotic therapy and aggressive wound care will lead to improvement.  Plan: 1. Change doxycycline to IV vancomycin and cefepime  Active Problems:   Venous stasis ulcers (HCC)   Normocytic anemia   DJD (degenerative joint disease)   S/P total knee arthroplasty, bilateral   History of DVT of lower extremity   Scheduled Meds: . doxycycline  100 mg Oral Q12H  . hydroxychloroquine  200 mg Oral BID  . methimazole  7.5 mg Oral Daily  . metoprolol tartrate  25 mg Oral BID  . predniSONE  5-10 mg Oral Q breakfast  . senna  1 tablet Oral BID  . Warfarin - Pharmacist Dosing Inpatient   Does not apply q1800   Continuous Infusions: PRN Meds:.acetaminophen **OR** acetaminophen, ondansetron **OR** ondansetron (ZOFRAN) IV, oxyCODONE, polyethylene glycol  HPI: Shirley Waters is a 77 y.o. female with a history of venous stasis since at least 2007.  I saw her in April after she developed ulcers on both lower extremities.  By the time she came to see me the ulcer on her left leg had healed and the ulcer on her right leg was improving.  She had been treated with oral trimethoprim sulfamethoxazole and clindamycin for superficial infection with MRSA, Proteus and Klebsiella but did not have any evidence of active infection at time of that visit.  At the end of April she developed acute  cellulitis of her left leg and was hospitalized in [place], [place].  She also developed worsening of her right leg ulcer with purulent drainage she recalls receiving IV vancomycin, piperacillin tazobactam and ciprofloxacin.  He was hospitalized until early June.  He was home only about 9 days before she started developing weeping from her wounds again and was readmitted.  She was started back on IV antibiotics and released on August 10.  She became alarmed when her wound started looking worse and a friend that drove her back to [facility] yesterday to see if I can see her.  She was admitted yesterday.   Review of Systems: Review of Systems  Constitutional: Positive for malaise/fatigue. Negative for chills, diaphoresis and fever.  Gastrointestinal: Negative for abdominal pain, diarrhea, nausea and vomiting.  Musculoskeletal:       She has had a recent increase in swelling of both lower legs.  Skin:       As noted in HPI.  Psychiatric/Behavioral: The patient is nervous/anxious.     Past Medical History:  Diagnosis Date  . Atrial fibrillation (HCC)   . CHF (congestive heart failure) (HCC)   . Chronic pain   . COPD (chronic obstructive pulmonary disease) (HCC)   . 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 DVT (deep venous thrombosis) (HCC)  history of, RLE  . Hypertension   . RA (rheumatoid arthritis) (HCC)     Social History   Tobacco Use  . Smoking status: Former Smoker    Years: 44.00    Last attempt to quit: 03/26/2008    Years since quitting: 9.9  . Smokeless tobacco: Never Used  Substance Use Topics  . Alcohol use: No    Alcohol/week: 0.0 standard drinks  . Drug use: No    Family History  Problem Relation Age of Onset  . Stroke Daughter    Allergies  Allergen Reactions  . Latex Rash  . Levofloxacin Palpitations    OBJECTIVE: Blood pressure (!) 101/51, pulse (!) 117, temperature 98.2 F (36.8 C), temperature source Oral, resp. rate 18, SpO2 95 %.  Physical  Exam  Constitutional: She is oriented to person, place, and time.  She is anxious but otherwise in good spirits.  She sitting up in a chair.  Neurological: She is alert and oriented to person, place, and time.  Skin:  I reviewed the pictures of her wounds with Helmut Muster, wound care specialist.  Psychiatric: She has a normal mood and affect.    Lab Results Lab Results  Component Value Date   WBC 7.1 03/01/2018   HGB 9.6 (L) 03/01/2018   HCT 31.5 (L) 03/01/2018   MCV 86.3 03/01/2018   PLT 303 03/01/2018    Lab Results  Component Value Date   CREATININE 1.18 (H) 02/28/2018   BUN 9 02/28/2018   NA 137 02/28/2018   K 3.2 (L) 02/28/2018   CL 89 (L) 02/28/2018   CO2 34 (H) 02/28/2018    Lab Results  Component Value Date   ALT 9 02/28/2018   AST 20 02/28/2018   ALKPHOS 65 02/28/2018   BILITOT 0.7 02/28/2018     Microbiology: No results found for this or any previous visit (from the past 240 hour(s)).  Cliffton Asters, MD [facility] for Infectious Disease [facility] [phone] pager   [phone] cell 03/01/2018, 10:27 AM

## 2018-03-02 ENCOUNTER — Inpatient Hospital Stay (HOSPITAL_COMMUNITY): Payer: Medicare Other

## 2018-03-02 DIAGNOSIS — M542 Cervicalgia: Secondary | ICD-10-CM

## 2018-03-02 DIAGNOSIS — T148XXA Other injury of unspecified body region, initial encounter: Secondary | ICD-10-CM

## 2018-03-02 DIAGNOSIS — R791 Abnormal coagulation profile: Secondary | ICD-10-CM

## 2018-03-02 DIAGNOSIS — Z881 Allergy status to other antibiotic agents status: Secondary | ICD-10-CM

## 2018-03-02 DIAGNOSIS — G8929 Other chronic pain: Secondary | ICD-10-CM

## 2018-03-02 DIAGNOSIS — L089 Local infection of the skin and subcutaneous tissue, unspecified: Secondary | ICD-10-CM | POA: Diagnosis present

## 2018-03-02 DIAGNOSIS — Z91040 Latex allergy status: Secondary | ICD-10-CM

## 2018-03-02 LAB — BASIC METABOLIC PANEL
Anion gap: 7 (ref 5–15)
BUN: 11 mg/dL (ref 8–23)
CHLORIDE: 93 mmol/L — AB (ref 98–111)
CO2: 35 mmol/L — AB (ref 22–32)
Calcium: 8.2 mg/dL — ABNORMAL LOW (ref 8.9–10.3)
Creatinine, Ser: 1.12 mg/dL — ABNORMAL HIGH (ref 0.44–1.00)
GFR calc Af Amer: 54 mL/min — ABNORMAL LOW (ref >60)
GFR calc non Af Amer: 46 mL/min — ABNORMAL LOW (ref >60)
Glucose, Bld: 89 mg/dL (ref 70–99)
Potassium: 2.9 mmol/L — ABNORMAL LOW (ref 3.5–5.1)
Sodium: 135 mmol/L (ref 135–145)

## 2018-03-02 LAB — PROTIME-INR
INR: 3.52
Prothrombin Time: 35 seconds — ABNORMAL HIGH (ref 11.4–15.2)

## 2018-03-02 LAB — CBC
HEMATOCRIT: 28.5 % — AB (ref 36.0–46.0)
Hemoglobin: 8.6 g/dL — ABNORMAL LOW (ref 12.0–15.0)
MCH: 26.2 pg (ref 26.0–34.0)
MCHC: 30.2 g/dL (ref 30.0–36.0)
MCV: 86.9 fL (ref 78.0–100.0)
Platelets: 308 10*3/uL (ref 150–400)
RBC: 3.28 MIL/uL — ABNORMAL LOW (ref 3.87–5.11)
RDW: 14.9 % (ref 11.5–15.5)
WBC: 7 10*3/uL (ref 4.0–10.5)

## 2018-03-02 LAB — HEMOGLOBIN A1C
Hgb A1c MFr Bld: 4.8 % (ref 4.8–5.6)
Mean Plasma Glucose: 91.06 mg/dL

## 2018-03-02 MED ORDER — POTASSIUM CHLORIDE CRYS ER 20 MEQ PO TBCR
40.0000 meq | EXTENDED_RELEASE_TABLET | ORAL | Status: AC
  Administered 2018-03-02 (×2): 40 meq via ORAL
  Filled 2018-03-02 (×2): qty 2

## 2018-03-02 MED ORDER — PREDNISONE 5 MG PO TABS
5.0000 mg | ORAL_TABLET | Freq: Every day | ORAL | Status: DC | PRN
  Administered 2018-03-04: 5 mg via ORAL

## 2018-03-02 MED ORDER — VITAMIN K1 10 MG/ML IJ SOLN: 5.0000 mg | Freq: Once | INTRAVENOUS | Status: DC

## 2018-03-02 MED ORDER — POTASSIUM CHLORIDE CRYS ER 20 MEQ PO TBCR
40.0000 meq | EXTENDED_RELEASE_TABLET | Freq: Two times a day (BID) | ORAL | Status: DC
  Administered 2018-03-02: 40 meq via ORAL
  Filled 2018-03-02: qty 2

## 2018-03-02 MED ORDER — VITAMIN K1 10 MG/ML IJ SOLN
1.0000 mg | Freq: Once | INTRAVENOUS | Status: AC
  Administered 2018-03-02: 1 mg via INTRAVENOUS
  Filled 2018-03-02: qty 0.1

## 2018-03-02 MED ORDER — PREDNISONE 5 MG PO TABS
5.0000 mg | ORAL_TABLET | Freq: Every day | ORAL | Status: DC
  Administered 2018-03-03 – 2018-03-07 (×5): 5 mg via ORAL
  Filled 2018-03-02 (×5): qty 1

## 2018-03-02 NOTE — Progress Notes (Signed)
Internal Medicine Attending  Date: 03/02/2018  Patient name: Shirley Waters Medical record number: [id number] Date of birth: Aug 21, 1940 Age: 77 y.o. Gender: female  I saw and evaluated the patient. I reviewed the resident's note by Dr. Nedra Hai and I agree with the resident's findings and plans as documented in his progress note.  When seen on rounds this morning Shirley Waters was unchanged with regards to the slight tenderness around her legs. She states that during the day she has her legs elevated. Examination reveals a brownish green stain on the bilateral Kerlix dressings. We are in the process of reversing her anticoagulation with IV vitamin K. We are hopeful we can get the INR to less than 1.8 by tomorrow although this is unlikely to occur that quickly. If this is the case the debridement will take place next week. She states she is interested in having wound care here in [place] and we will also provide her with a resource to set up such follow-up once she is discharged.

## 2018-03-02 NOTE — Progress Notes (Addendum)
Subjective:  Shirley Waters is a 77 yo F w/ PMH of SLE, RA, Chronic venous insufficeincy and DJD s/p bilateral knee replacements presenting to the hospital for bilateral chronic ulcers on admit day 2  Shirley Waters was examined and evaluated at bedside this AM. She states she feels fine. She wishes her food was better, requesting changing from low carb diet to something more palatable. She continues to endorse burning pain around her ulcers but states she can bear it. She states otherwise no acute events overnight but her chronic neck pain is bothering her. She states she has had hx of degenerative disc disease in the past and she has always had chronic neck pain. She also states she has been keeping her feet elevated. Denies any F/N/V/D/C  Objective:  Vital signs in last 24 hours: Vitals:   03/01/18 1100 03/01/18 1313 03/01/18 2031 03/02/18 0554  BP:  101/73 (!) 107/56 (!) 104/44  Pulse:  100 77 68  Resp:  16 16 16   Temp:  99.4 F (37.4 C) 98.8 F (37.1 C) 98.2 F (36.8 C)  TempSrc:  Oral Oral Oral  SpO2:  98% 98% 97%  Weight: 106 kg     Height: 5\' 4"  (1.626 m)      Physical Exam  Constitutional: She is well-developed, well-nourished, and in no distress. No distress.  HENT:  Head: Normocephalic and atraumatic.  Mouth/Throat: Oropharynx is clear and moist. No oropharyngeal exudate.  Eyes: Pupils are equal, round, and reactive to light. Conjunctivae and EOM are normal. No scleral icterus.  Neck: No JVD present. No thyromegaly present.  Cardiovascular: Normal rate, regular rhythm, normal heart sounds and intact distal pulses.  Pulmonary/Chest: Effort normal and breath sounds normal. No respiratory distress. She has no wheezes.  Abdominal: Soft. Bowel sounds are normal. She exhibits no distension. There is no tenderness. There is no guarding.  Skin: She is not diaphoretic.  Bilateral lower extremity ulcers covered in bandaging. Did not remove to inspect   [Addendum] Lower extremity  arterial doppler result from [facility]: Exam date 01/16/2018  Findings: Right CFA: 155 Right DFA: 126 Right SFA Proximal: 128 Right SFA Mid: 87 Right SFA DST: 133 Right Pop A: 109 Right PTA Proximal: 82 Right peroneal A: 132 Right ATA: 162 Right PTA DST: 47 Right DPA: 40  Left CFA: 133 Left DFA: 86 Left SFA Proximal: 125 Left SFA Mid: 143 Left SFA Mid: 143 Left SFA Dst: 131 Left Pop A: 109 Left PTA Proximal: 76 Left Peroneal A: Not Visible Left ATA: 136 Left PTA DST: 31 Left DPA: 93  Weak biphasic waveform noted in bilateral PTA and DPA. Biphasic waveforms noted throughout the remainer of the bilateral lower extremities  Impression: No evidence of arterial occlusion. Limited evaluation of the left peroneal artery. 30-40% stenosis within the right common femoral artery and right anterior tibial artery. Diminished waveforms through both legs are reflective of at least moderate peripheral artery disease.   Signed by [facility], MD 01/16/2018  Assessment/Plan:  Active Problems:   Venous stasis ulcers (HCC)   Normocytic anemia   DJD (degenerative joint disease)   S/P total knee arthroplasty, bilateral   History of DVT of lower extremity   Rheumatoid arthritis involving multiple joints (HCC)   Permanent atrial fibrillation (HCC)   Morbid obesity (HCC)   Venous stasis ulcer (HCC)  Shirley Waters is a 77 yo F w/ PMH of SLE, RA, Chronic venous insufficeincy and DJD s/p bilateral knee replacements presenting to the hospital for  bilateral chronic ulcers. She was evaluated by vascular surgery who recommend OR debridement. She was on Coumadin for history of A.fib with INR of 3.8. She will not be ready for OR until her INR comes down, which may take couple days  Lower extremity ulcers 2/2 venous stasis vs arterial insufficiency Known Venous stasis since 2007. Treated with compression stockings. Started developing ulcers recently. Left sided ulcer in 09/2017 requiring IV  abx and admission. Right side ulcer developed during SNF post discharge and has been enlarging progressively despite wound care at her SNF. Her ulcers may have delayed healing due to her prednisone for her RA. We will evaluate for other sources of delayed wound healing, such as arterial insufficiency during this admission if we cannot find records of prior testing.  - F/u Blood Cultures - C/w Vanc/Cefepime per ID - Appreciate ID recs - Oxycodone 10mg  q6 PRN for pain - Dressing changes per wound care - Vascular surgery recommend OR tomorrow for debridement. Coumadin stopped yesterday and INR today at 3.5. Will check INR tomorrow - Give 1mg  Vitamin K IV today to reverse Coumadin - Will attempt to access prior ABI results from [facility] [Addendum: See above for arterial doppler results] - C/w Lasix 40mg  PO  A.fib  - Coumadin stopped for OR debridement per vascular surgery - Rate control w/ metoprolol 50mg  BID  RA/SLE - Cervical X-ray today to r/o Atlantoaxial instability in anticipation of surgery - c/w home meds: Plaquenil 200mg  Bid, Prednisone 5mg  daily with additional 5mg  PRN for arthritic pain  Hyperthyroidism - C/w home med: methimazole 7.5mg   DVT prophx: Coumadin Diet: Regular Bowel: Senokot Code: FULL  Dispo: Anticipated discharge in approximately 5 day(s).   , MD 03/02/2018, 11:23 AM Pager: [phone]

## 2018-03-02 NOTE — Progress Notes (Signed)
INR remains elevated. I have her on the OR schedule tomorrow for debridement of bilateral leg ulcers.  Her INR will need to be less than 1.8 in ordder to proceed.  I stopped coumadin last night.  Will defer to primary team for additional measures to reverse her coumadin  Shirley Waters

## 2018-03-02 NOTE — Consult Note (Signed)
WOC Nurse wound follow up Vascular services has evaluated and would like to debride wounds of devitalized tissue once INR allows. ABI needs to be repeated to rule out arterial insufficiency as well.  Wounds have high amount of exudate.  Will initiate topical therapy.   Cleanse bilateral lower legs with soap and water and pat dry.  Apply calcium alginate to wound bed  Cover with ABD pads and kerlix.  Change daily until surgical debridement and ABI is performed.  Will not follow at this time.  Please re-consult if needed.  Maple Hudson RN BSN CWON Pager [phone]

## 2018-03-02 NOTE — Progress Notes (Signed)
Patient ID: Shirley Waters, female   DOB: Mar 04, 1941, 77 y.o.   MRN: [id number]         [facility] for Infectious Disease  Date of Admission:  02/28/2018   Total days of antibiotics 3        Day 2 vancomycin        Day 2 cefepime         ASSESSMENT: She has severe bilateral venous stasis ulcers of both lower legs with superficial infection.  She is scheduled for debridement once her INR is down to a safe level.  Continue vancomycin and cefepime for now targeting MRSA and Pseudomonas.  I do not think that she will need a long course of antibiotics.  The most important strategies going forward will be excellent wound care and compression.  PLAN: 1. Continue current antibiotics  Principal Problem:   Wound infection Active Problems:   Venous stasis ulcers (HCC)   Normocytic anemia   DJD (degenerative joint disease)   S/P total knee arthroplasty, bilateral   History of DVT of lower extremity   Rheumatoid arthritis involving multiple joints (HCC)   Permanent atrial fibrillation (HCC)   Morbid obesity (HCC)   Scheduled Meds: . furosemide  40 mg Oral Daily  . hydroxychloroquine  200 mg Oral BID  . methimazole  7.5 mg Oral Daily  . metoprolol tartrate  50 mg Oral BID  . potassium chloride  40 mEq Oral BID  . [START ON 03/03/2018] predniSONE  5 mg Oral Q breakfast  . senna  1 tablet Oral BID   Continuous Infusions: . ceFEPime (MAXIPIME) IV Stopped (03/01/18 1443)  . vancomycin 250 mL/hr at 03/01/18 1600   PRN Meds:.acetaminophen **OR** acetaminophen, ondansetron **OR** ondansetron (ZOFRAN) IV, oxyCODONE, polyethylene glycol, [START ON 03/03/2018] predniSONE   SUBJECTIVE: She says that the swelling in her markedly since admission.  She says that she has not seen her legs some small the past several months.  Review of Systems: Review of Systems  Constitutional: Negative for chills, fever and malaise/fatigue.  Musculoskeletal: Positive for joint pain.    Allergies  Allergen  Reactions  . Latex Rash  . Levofloxacin Palpitations    OBJECTIVE: Vitals:   03/01/18 1100 03/01/18 1313 03/01/18 2031 03/02/18 0554  BP:  101/73 (!) 107/56 (!) 104/44  Pulse:  100 77 68  Resp:  16 16 16   Temp:  99.4 F (37.4 C) 98.8 F (37.1 C) 98.2 F (36.8 C)  TempSrc:  Oral Oral Oral  SpO2:  98% 98% 97%  Weight: 106 kg     Height: 5\' 4"  (1.626 m)      Body mass index is 40.1 kg/m.  Physical Exam  Constitutional: She is oriented to person, place, and time.  She is talkative and in good spirits.  She is sitting up in a chair with her legs elevated.  Neurological: She is alert and oriented to person, place, and time.  Skin:  She has gauze dressings on both lower extremities have been changed again today.  She has bright yellow-green drainage from both legs with slight odor.  Psychiatric: She has a normal mood and affect.    Lab Results Lab Results  Component Value Date   WBC 7.0 03/02/2018   HGB 8.6 (L) 03/02/2018   HCT 28.5 (L) 03/02/2018   MCV 86.9 03/02/2018   PLT 308 03/02/2018    Lab Results  Component Value Date   CREATININE 1.12 (H) 03/02/2018   BUN 11 03/02/2018   NA  135 03/02/2018   K 2.9 (L) 03/02/2018   CL 93 (L) 03/02/2018   CO2 35 (H) 03/02/2018    Lab Results  Component Value Date   ALT 9 02/28/2018   AST 20 02/28/2018   ALKPHOS 65 02/28/2018   BILITOT 0.7 02/28/2018     Microbiology: Recent Results (from the past 240 hour(s))  Culture, blood (routine x 2)     Status: None (Preliminary result)   Collection Time: 02/28/18  2:59 PM  Result Value Ref Range Status   Specimen Description BLOOD RIGHT WRIST  Final   Special Requests   Final    BOTTLES DRAWN AEROBIC AND ANAEROBIC Blood Culture results may not be optimal due to an inadequate volume of blood received in culture bottles   Culture   Final    NO GROWTH 2 DAYS Performed at [facility], [address] [place], [place] [address]    Report Status PENDING  Incomplete    Culture, blood (routine x 2)     Status: None (Preliminary result)   Collection Time: 02/28/18  3:38 PM  Result Value Ref Range Status   Specimen Description BLOOD SITE NOT SPECIFIED  Final   Special Requests   Final    BOTTLES DRAWN AEROBIC AND ANAEROBIC Blood Culture results may not be optimal due to an excessive volume of blood received in culture bottles   Culture   Final    NO GROWTH 2 DAYS Performed at [facility], [address], [place], [place] [address]    Report Status PENDING  Incomplete    Cliffton Asters, MD [facility] for Infectious Disease [facility] [phone] pager   [phone] cell 03/02/2018, 11:26 AM

## 2018-03-03 ENCOUNTER — Encounter (HOSPITAL_COMMUNITY): Payer: Self-pay | Admitting: Certified Registered"

## 2018-03-03 ENCOUNTER — Encounter (HOSPITAL_COMMUNITY)
Admission: EM | Disposition: A | Discharge status: Home Health | Payer: Self-pay | Source: Home / Self Care | Attending: Internal Medicine

## 2018-03-03 ENCOUNTER — Inpatient Hospital Stay (HOSPITAL_COMMUNITY): Payer: Medicare Other | Admitting: Anesthesiology

## 2018-03-03 DIAGNOSIS — I70242 Atherosclerosis of native arteries of left leg with ulceration of calf: Secondary | ICD-10-CM

## 2018-03-03 DIAGNOSIS — I70232 Atherosclerosis of native arteries of right leg with ulceration of calf: Secondary | ICD-10-CM

## 2018-03-03 HISTORY — PX: WOUND DEBRIDEMENT: SHX247

## 2018-03-03 LAB — BASIC METABOLIC PANEL
Anion gap: 4 — ABNORMAL LOW (ref 5–15)
BUN: 11 mg/dL (ref 8–23)
CALCIUM: 8.5 mg/dL — AB (ref 8.9–10.3)
CHLORIDE: 99 mmol/L (ref 98–111)
CO2: 34 mmol/L — AB (ref 22–32)
Creatinine, Ser: 0.97 mg/dL (ref 0.44–1.00)
GFR calc non Af Amer: 55 mL/min — ABNORMAL LOW (ref >60)
GLUCOSE: 87 mg/dL (ref 70–99)
POTASSIUM: 4.2 mmol/L (ref 3.5–5.1)
Sodium: 137 mmol/L (ref 135–145)

## 2018-03-03 LAB — PROTIME-INR
INR: 1.78
Prothrombin Time: 20.5 seconds — ABNORMAL HIGH (ref 11.4–15.2)

## 2018-03-03 LAB — CBC
HEMATOCRIT: 29.6 % — AB (ref 36.0–46.0)
HEMOGLOBIN: 8.7 g/dL — AB (ref 12.0–15.0)
MCH: 25.9 pg — ABNORMAL LOW (ref 26.0–34.0)
MCHC: 29.4 g/dL — ABNORMAL LOW (ref 30.0–36.0)
MCV: 88.1 fL (ref 78.0–100.0)
Platelets: 331 10*3/uL (ref 150–400)
RBC: 3.36 MIL/uL — ABNORMAL LOW (ref 3.87–5.11)
RDW: 14.8 % (ref 11.5–15.5)
WBC: 7.4 10*3/uL (ref 4.0–10.5)

## 2018-03-03 LAB — SURGICAL PCR SCREEN
MRSA, PCR: NEGATIVE
STAPHYLOCOCCUS AUREUS: NEGATIVE

## 2018-03-03 SURGERY — DEBRIDEMENT, WOUND
Anesthesia: General | Site: Leg Lower | Laterality: Bilateral

## 2018-03-03 MED ORDER — MORPHINE SULFATE (PF) 2 MG/ML IV SOLN
2.0000 mg | INTRAVENOUS | Status: DC | PRN
  Administered 2018-03-03 – 2018-03-04 (×6): 2 mg via INTRAVENOUS
  Filled 2018-03-03 (×6): qty 1

## 2018-03-03 MED ORDER — HYDROMORPHONE HCL 1 MG/ML IJ SOLN
INTRAMUSCULAR | Status: AC
  Filled 2018-03-03: qty 1

## 2018-03-03 MED ORDER — PROTAMINE SULFATE 10 MG/ML IV SOLN
INTRAVENOUS | Status: AC
  Filled 2018-03-03: qty 5

## 2018-03-03 MED ORDER — EPHEDRINE SULFATE-NACL 50-0.9 MG/10ML-% IV SOSY
PREFILLED_SYRINGE | INTRAVENOUS | Status: DC | PRN
  Administered 2018-03-03 (×2): 10 mg via INTRAVENOUS

## 2018-03-03 MED ORDER — DEXMEDETOMIDINE HCL 200 MCG/2ML IV SOLN
INTRAVENOUS | Status: DC | PRN
  Administered 2018-03-03: 12 ug via INTRAVENOUS

## 2018-03-03 MED ORDER — FENTANYL CITRATE (PF) 100 MCG/2ML IJ SOLN
INTRAMUSCULAR | Status: AC
  Filled 2018-03-03: qty 2

## 2018-03-03 MED ORDER — LIDOCAINE 2% (20 MG/ML) 5 ML SYRINGE
INTRAMUSCULAR | Status: DC | PRN
  Administered 2018-03-03: 60 mg via INTRAVENOUS

## 2018-03-03 MED ORDER — PROPOFOL 10 MG/ML IV BOLUS
INTRAVENOUS | Status: DC | PRN
  Administered 2018-03-03: 50 mg via INTRAVENOUS
  Administered 2018-03-03: 100 mg via INTRAVENOUS
  Administered 2018-03-03: 50 mg via INTRAVENOUS

## 2018-03-03 MED ORDER — PROPOFOL 500 MG/50ML IV EMUL
INTRAVENOUS | Status: DC | PRN
  Administered 2018-03-03: 20 ug/kg/min via INTRAVENOUS

## 2018-03-03 MED ORDER — FENTANYL CITRATE (PF) 250 MCG/5ML IJ SOLN
INTRAMUSCULAR | Status: DC | PRN
  Administered 2018-03-03 (×2): 50 ug via INTRAVENOUS
  Administered 2018-03-03 (×2): 25 ug via INTRAVENOUS
  Administered 2018-03-03 (×4): 50 ug via INTRAVENOUS
  Administered 2018-03-03: 100 ug via INTRAVENOUS
  Administered 2018-03-03: 50 ug via INTRAVENOUS

## 2018-03-03 MED ORDER — MIDAZOLAM HCL 2 MG/2ML IJ SOLN
INTRAMUSCULAR | Status: DC | PRN
  Administered 2018-03-03: 1 mg via INTRAVENOUS

## 2018-03-03 MED ORDER — SODIUM CHLORIDE 0.9 % IV SOLN
2.0000 g | Freq: Two times a day (BID) | INTRAVENOUS | Status: AC
  Administered 2018-03-03 – 2018-03-06 (×6): 2 g via INTRAVENOUS
  Filled 2018-03-03 (×7): qty 2

## 2018-03-03 MED ORDER — FENTANYL CITRATE (PF) 100 MCG/2ML IJ SOLN
25.0000 ug | INTRAMUSCULAR | Status: DC | PRN
  Administered 2018-03-03 (×2): 50 ug via INTRAVENOUS

## 2018-03-03 MED ORDER — PHENYLEPHRINE 40 MCG/ML (10ML) SYRINGE FOR IV PUSH (FOR BLOOD PRESSURE SUPPORT)
PREFILLED_SYRINGE | INTRAVENOUS | Status: DC | PRN
  Administered 2018-03-03 (×4): 80 ug via INTRAVENOUS

## 2018-03-03 MED ORDER — MIDAZOLAM HCL 2 MG/2ML IJ SOLN
INTRAMUSCULAR | Status: AC
  Filled 2018-03-03: qty 2

## 2018-03-03 MED ORDER — ONDANSETRON HCL 4 MG/2ML IJ SOLN
INTRAMUSCULAR | Status: DC | PRN
  Administered 2018-03-03: 4 mg via INTRAVENOUS

## 2018-03-03 MED ORDER — 0.9 % SODIUM CHLORIDE (POUR BTL) OPTIME
TOPICAL | Status: DC | PRN
  Administered 2018-03-03: 1000 mL

## 2018-03-03 MED ORDER — WARFARIN SODIUM 5 MG PO TABS
5.0000 mg | ORAL_TABLET | Freq: Every day | ORAL | Status: DC
  Administered 2018-03-03: 5 mg via ORAL
  Filled 2018-03-03: qty 1

## 2018-03-03 MED ORDER — WARFARIN - PHARMACIST DOSING INPATIENT
Freq: Every day | Status: DC
  Administered 2018-03-05: 17:00:00

## 2018-03-03 MED ORDER — OXYCODONE HCL 5 MG/5ML PO SOLN: 5.0000 mg | Freq: Once | ORAL | Status: AC | PRN

## 2018-03-03 MED ORDER — EPHEDRINE 5 MG/ML INJ
INTRAVENOUS | Status: AC
  Filled 2018-03-03: qty 10

## 2018-03-03 MED ORDER — ONDANSETRON HCL 4 MG/2ML IJ SOLN: 4.0000 mg | Freq: Once | INTRAMUSCULAR | Status: DC | PRN

## 2018-03-03 MED ORDER — PROPOFOL 10 MG/ML IV BOLUS
INTRAVENOUS | Status: AC
  Filled 2018-03-03: qty 20

## 2018-03-03 MED ORDER — HYDROMORPHONE HCL 1 MG/ML IJ SOLN
0.2500 mg | INTRAMUSCULAR | Status: AC | PRN
  Administered 2018-03-03 (×4): 0.5 mg via INTRAVENOUS

## 2018-03-03 MED ORDER — ONDANSETRON HCL 4 MG/2ML IJ SOLN
INTRAMUSCULAR | Status: AC
  Filled 2018-03-03: qty 2

## 2018-03-03 MED ORDER — OXYCODONE HCL 5 MG PO TABS
5.0000 mg | ORAL_TABLET | Freq: Once | ORAL | Status: AC | PRN
  Administered 2018-03-03: 5 mg via ORAL

## 2018-03-03 MED ORDER — PROPOFOL 1000 MG/100ML IV EMUL
INTRAVENOUS | Status: AC
  Filled 2018-03-03: qty 100

## 2018-03-03 MED ORDER — GLYCOPYRROLATE PF 0.2 MG/ML IJ SOSY
PREFILLED_SYRINGE | INTRAMUSCULAR | Status: DC | PRN
  Administered 2018-03-03: .1 mg via INTRAVENOUS

## 2018-03-03 MED ORDER — FENTANYL CITRATE (PF) 250 MCG/5ML IJ SOLN
INTRAMUSCULAR | Status: AC
  Filled 2018-03-03: qty 5

## 2018-03-03 MED ORDER — PHENYLEPHRINE 40 MCG/ML (10ML) SYRINGE FOR IV PUSH (FOR BLOOD PRESSURE SUPPORT)
PREFILLED_SYRINGE | INTRAVENOUS | Status: AC
  Filled 2018-03-03: qty 10

## 2018-03-03 MED ORDER — LACTATED RINGERS IV SOLN
INTRAVENOUS | Status: DC | PRN
  Administered 2018-03-03: 13:00:00 via INTRAVENOUS

## 2018-03-03 MED ORDER — OXYCODONE HCL 5 MG PO TABS
ORAL_TABLET | ORAL | Status: AC
  Administered 2018-03-04: 15 mg via ORAL
  Filled 2018-03-03: qty 1

## 2018-03-03 MED ORDER — GLYCOPYRROLATE PF 0.2 MG/ML IJ SOSY
PREFILLED_SYRINGE | INTRAMUSCULAR | Status: AC
  Filled 2018-03-03: qty 1

## 2018-03-03 MED ORDER — LIDOCAINE 2% (20 MG/ML) 5 ML SYRINGE
INTRAMUSCULAR | Status: AC
  Filled 2018-03-03: qty 5

## 2018-03-03 MED ORDER — VANCOMYCIN HCL IN DEXTROSE 750-5 MG/150ML-% IV SOLN
750.0000 mg | Freq: Two times a day (BID) | INTRAVENOUS | Status: AC
  Administered 2018-03-03: 1500 mg via INTRAVENOUS
  Administered 2018-03-04 – 2018-03-06 (×6): 750 mg via INTRAVENOUS
  Filled 2018-03-03 (×7): qty 150

## 2018-03-03 SURGICAL SUPPLY — 39 items
BANDAGE ACE 4X5 VEL STRL LF (GAUZE/BANDAGES/DRESSINGS) IMPLANT
BANDAGE ACE 6X5 VEL STRL LF (GAUZE/BANDAGES/DRESSINGS) ×6 IMPLANT
BNDG GAUZE ELAST 4 BULKY (GAUZE/BANDAGES/DRESSINGS) ×6 IMPLANT
CANISTER SUCT 3000ML PPV (MISCELLANEOUS) ×3 IMPLANT
CLIP VESOCCLUDE MED 6/CT (CLIP) ×3 IMPLANT
CLIP VESOCCLUDE SM WIDE 6/CT (CLIP) ×3 IMPLANT
CONT SPEC 4OZ CLIKSEAL STRL BL (MISCELLANEOUS) ×3 IMPLANT
COVER SURGICAL LIGHT HANDLE (MISCELLANEOUS) ×3 IMPLANT
DRAPE EXTREMITY BILATERAL (DRAPES) ×3 IMPLANT
DRAPE HALF SHEET 40X57 (DRAPES) IMPLANT
DRAPE U-SHAPE 76X120 STRL (DRAPES) IMPLANT
ELECT CAUTERY BLADE 6.4 (BLADE) ×3 IMPLANT
ELECT REM PT RETURN 9FT ADLT (ELECTROSURGICAL) ×3
ELECTRODE REM PT RTRN 9FT ADLT (ELECTROSURGICAL) ×1 IMPLANT
GAUZE SPONGE 4X4 12PLY STRL (GAUZE/BANDAGES/DRESSINGS) ×6 IMPLANT
GAUZE XEROFORM 5X9 LF (GAUZE/BANDAGES/DRESSINGS) ×6 IMPLANT
GLOVE BIOGEL PI IND STRL 7.5 (GLOVE) ×1 IMPLANT
GLOVE BIOGEL PI INDICATOR 7.5 (GLOVE) ×2
GLOVE SURG SS PI 7.5 STRL IVOR (GLOVE) ×3 IMPLANT
GOWN STRL REUS W/ TWL LRG LVL3 (GOWN DISPOSABLE) ×2 IMPLANT
GOWN STRL REUS W/ TWL XL LVL3 (GOWN DISPOSABLE) ×1 IMPLANT
GOWN STRL REUS W/TWL LRG LVL3 (GOWN DISPOSABLE) ×4
GOWN STRL REUS W/TWL XL LVL3 (GOWN DISPOSABLE) ×2
KIT BASIN OR (CUSTOM PROCEDURE TRAY) ×3 IMPLANT
KIT TURNOVER KIT B (KITS) ×3 IMPLANT
NS IRRIG 1000ML POUR BTL (IV SOLUTION) ×3 IMPLANT
PACK CV ACCESS (CUSTOM PROCEDURE TRAY) IMPLANT
PACK GENERAL/GYN (CUSTOM PROCEDURE TRAY) ×3 IMPLANT
PACK UNIVERSAL I (CUSTOM PROCEDURE TRAY) ×3 IMPLANT
PAD ARMBOARD 7.5X6 YLW CONV (MISCELLANEOUS) ×6 IMPLANT
SUT ETHILON 3 0 PS 1 (SUTURE) IMPLANT
SUT VIC AB 2-0 CTX 36 (SUTURE) IMPLANT
SUT VIC AB 3-0 SH 27 (SUTURE)
SUT VIC AB 3-0 SH 27X BRD (SUTURE) IMPLANT
SUT VICRYL 4-0 PS2 18IN ABS (SUTURE) IMPLANT
SWAB CULTURE LIQ STUART DBL (MISCELLANEOUS) ×6 IMPLANT
TOWEL GREEN STERILE (TOWEL DISPOSABLE) ×3 IMPLANT
TUBE ANAEROBIC SPECIMEN COL (MISCELLANEOUS) ×6 IMPLANT
WATER STERILE IRR 1000ML POUR (IV SOLUTION) ×3 IMPLANT

## 2018-03-03 NOTE — Progress Notes (Signed)
Status post debridement of bilateral lower extremity ulcers Wound cultures and biopsies were taken during the procedure. We will change her dressing on Sunday or Monday. Okay to restart Coumadin.  Durene Cal

## 2018-03-03 NOTE — Care Management Important Message (Signed)
Important Message  Patient Details  Name: Shirley Waters MRN: [id number] Date of Birth: March 26, 1941   Medicare Important Message Given:  Yes    Micayla Brathwaite Stefan Church 03/03/2018, 3:35 PM

## 2018-03-03 NOTE — Anesthesia Postprocedure Evaluation (Signed)
Anesthesia Post Note  Patient: Porfiria Heinrich  Procedure(s) Performed: DEBRIDEMENT WOUNDS LOWER EXTREMITY (Bilateral Leg Lower)     Patient location during evaluation: PACU Anesthesia Type: General Level of consciousness: awake and alert Pain management: pain level controlled Vital Signs Assessment: post-procedure vital signs reviewed and stable Respiratory status: spontaneous breathing, nonlabored ventilation and respiratory function stable Cardiovascular status: blood pressure returned to baseline and stable Postop Assessment: no apparent nausea or vomiting Anesthetic complications: no    Last Vitals:  Vitals:   03/03/18 1510 03/03/18 1537  BP: (!) 125/114 (!) 125/47  Pulse: 66 64  Resp: 20   Temp: (!) 36.4 C 36.6 C  SpO2: 100% 97%    Last Pain:  Vitals:   03/03/18 1537  TempSrc: Oral  PainSc:                  Beryle Lathe

## 2018-03-03 NOTE — Progress Notes (Signed)
ANTICOAGULATION CONSULT NOTE  Pharmacy Consult for warfarin Indication: atrial fibrillation, hx DVT  Allergies  Allergen Reactions  . Latex Rash  . Levofloxacin Palpitations   Vital Signs: Temp: 97.9 F (36.6 C) (08/23 1537) Temp Source: Oral (08/23 1537) BP: 125/47 (08/23 1537) Pulse Rate: 64 (08/23 1537)  Labs: Recent Labs    03/01/18 0448 03/02/18 0425 03/03/18 0432  HGB 9.6* 8.6* 8.7*  HCT 31.5* 28.5* 29.6*  PLT 303 308 331  LABPROT 33.9* 35.0* Shirley.5*  INR 3.38 3.52 1.78  CREATININE  --  1.12* 0.97    Estimated Creatinine Clearance: 58.6 mL/min (by C-G formula based on SCr of 0.97 mg/dL).   Medical History: Past Medical History:  Diagnosis Date  . Atrial fibrillation (HCC)   . CHF (congestive heart failure) (HCC)   . Chronic pain   . COPD (chronic obstructive pulmonary disease) (HCC)   . 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 DVT (deep venous thrombosis) (HCC)    history of, RLE  . Hypertension   . RA (rheumatoid arthritis) (HCC)     Medications:  Medications Prior to Admission  Medication Sig Dispense Refill Last Dose  . Bismuth Tribromoph-Petrolatum (XEROFORM PETROLAT GAUZE 1"X8" EX) Apply 1 application topically 3 (three) times daily.   02/27/2018 at Unknown time  . celecoxib (CELEBREX) 200 MG capsule Take by mouth 2 (two) times daily.    8/Shirley/2019 at Unknown time  . Cholecalciferol (VITAMIN D PO) Take 5,000 Units by mouth daily.   02/27/2018 at Unknown time  . furosemide (LASIX) 40 MG tablet Take 40 mg by mouth as needed for fluid.    02/26/2018  . hydroxychloroquine (PLAQUENIL) 200 MG tablet Take 200 mg by mouth 2 (two) times daily.    8/Shirley/2019 at Unknown time  . methimazole (TAPAZOLE) 5 MG tablet Take 7.5 mg by mouth daily.    02/27/2018 at Unknown time  . methotrexate 2.5 MG tablet Take Shirley mg by mouth once a week. Taking 8 tablets (2.5mg ) weekly   02/26/2018  . metolazone (ZAROXOLYN) 5 MG tablet Take 5 mg by mouth daily as needed (for fluid. takes  with furosemide).    02/26/2018  . metoprolol tartrate (LOPRESSOR) 25 MG tablet Take 25 mg by mouth 2 (two) times daily.    8/Shirley/2019 at 0800  . Multiple Vitamins-Minerals (MULTIVITAMIN ADULT EXTRA C PO) Take 1 tablet by mouth daily.    Past Week at Unknown time  . oxyCODONE (ROXICODONE) 15 MG immediate release tablet Take 15 mg by mouth every 6 (six) hours as needed for pain.    8/Shirley/2019 at 01000  . Oxycodone HCl 10 MG TABS Take 10 mg by mouth daily as needed (severe pain).    02/27/2018 at Unknown time  . predniSONE (DELTASONE) 5 MG tablet Take 5-10 tablets by mouth See admin instructions. Taking one 5mg  or one 10mg  tablet daily depending on arthritis symptoms   02/27/2018 at Unknown time  . Propylene Glycol (SYSTANE BALANCE) 0.6 % SOLN Apply 1 drop to eye as needed (dry eyes).   Past Week at Unknown time  . warfarin (COUMADIN) 5 MG tablet Take 5 mg by mouth one time only at 6 PM.    02/27/2018 at Unknown time  . predniSONE (DELTASONE) 10 MG tablet 5-10 mg daily with breakfast.    Not Taking at Unknown time    Assessment:  Shirley Waters on warfarin for atrial fibrillation. INR on admit 2.94, with last dose PTA on 8/19. Warfarin 5mg  daily  reported as PTA warfarin regimen. Patient here from [place] and there are no anticoagulation notes found in Care Everywhere.  INR this morning came back at 1.78, decrease from 3.52 yesterday after vitamin K 1 mg IV given on 8/22. Hgb 8.7, plt 331. No s/sx of bleeding. S/p debridement of bilateral venous stasis ulcers with biopsy and estimated blood loss of 100 mL. On concurrent cefepime and vancomycin. Also on methimazole which can decrease warfarin sensitivity.   Goal of Therapy:  INR 2-3 Monitor platelets by anticoagulation protocol: Yes   Plan:  Restart warfarin 5 mg tonight Daily CBC/ INR  Monitor for s/sx of bleeding  Girard Cooter, PharmD Clinical Pharmacist  Pager: [phone] Phone: [phone] 03/03/2018 6:16 PM

## 2018-03-03 NOTE — Transfer of Care (Signed)
Immediate Anesthesia Transfer of Care Note  Patient: Shirley Waters  Procedure(s) Performed: DEBRIDEMENT WOUNDS LOWER EXTREMITY (Bilateral Leg Lower)  Patient Location: PACU  Anesthesia Type:General  Level of Consciousness: awake  Airway & Oxygen Therapy: Patient connected to face mask oxygen  Post-op Assessment: Report given to RN, Post -op Vital signs reviewed and stable and Patient moving all extremities  Post vital signs: Reviewed and stable  Last Vitals:  Vitals Value Taken Time  BP 121/58 03/03/2018  1:41 PM  Temp    Pulse 66 03/03/2018  1:43 PM  Resp 23 03/03/2018  1:43 PM  SpO2 100 % 03/03/2018  1:43 PM  Vitals shown include unvalidated device data.  Last Pain:  Vitals:   03/03/18 0923  TempSrc:   PainSc: 9       Patients Stated Pain Goal: 1 (03/03/18 1740)  Complications: No apparent anesthesia complications

## 2018-03-03 NOTE — Anesthesia Preprocedure Evaluation (Addendum)
Anesthesia Evaluation  Patient identified by MRN, date of birth, ID band Patient awake    Reviewed: Allergy & Precautions, NPO status , Patient's Chart, lab work & pertinent test results  History of Anesthesia Complications Negative for: history of anesthetic complications  Airway Mallampati: III  TM Distance: >3 FB Neck ROM: Full    Dental  (+) Missing, Dental Advisory Given, Loose, Poor Dentition,    Pulmonary COPD (denies), former smoker,    breath sounds clear to auscultation       Cardiovascular hypertension, Pt. on medications and Pt. on home beta blockers (-) angina+CHF and + DVT  + dysrhythmias Atrial Fibrillation  Rhythm:Regular Rate:Normal     Neuro/Psych negative neurological ROS  negative psych ROS   GI/Hepatic negative GI ROS, Neg liver ROS,   Endo/Other  Morbid obesity  Renal/GU negative Renal ROS  Female GU complaint     Musculoskeletal  (+) Arthritis , Rheumatoid disorders,   Chronic pain    Abdominal (+) + obese,   Peds  Hematology  (+) anemia ,   Anesthesia Other Findings   Reproductive/Obstetrics                            Anesthesia Physical Anesthesia Plan  ASA: III  Anesthesia Plan: General   Post-op Pain Management:    Induction:   PONV Risk Score and Plan: 3 and Treatment may vary due to age or medical condition, Ondansetron and Propofol infusion  Airway Management Planned: LMA  Additional Equipment: None  Intra-op Plan:   Post-operative Plan: Extubation in OR  Informed Consent: I have reviewed the patients History and Physical, chart, labs and discussed the procedure including the risks, benefits and alternatives for the proposed anesthesia with the patient or authorized representative who has indicated his/her understanding and acceptance.   Dental advisory given  Plan Discussed with: CRNA and Anesthesiologist  Anesthesia Plan Comments:         Anesthesia Quick Evaluation

## 2018-03-03 NOTE — Progress Notes (Signed)
Subjective: Shirley Waters is a 77 yo F w/ PMH of SLE, RA, Chronic venous insufficeincy and DJD s/p bilateral knee replacements presenting to the hospital for bilateral chronic ulcers on admit day 3  Shirley Waters was examined and evaluated in her room sitting in her chair this AM. She states she feels fine but is a bit anxious about her surgery because she did not know when she would be going to the OR. She is also curious about the results of her Cervical X-ray. The results of the X-ray was explained to her and she was reassured that she will be going to her surgery at 2pm today. She expressed understanding. Denies any F/N/V/D/C. Denies CP, DOE, Palpitations  Objective:  Vital signs in last 24 hours: Vitals:   03/02/18 1951 03/02/18 2244 03/03/18 0432 03/03/18 1217  BP: (!) 117/54 (!) 110/51 (!) 125/53   Pulse: 67 66 69   Resp: 16  16   Temp: 98.4 F (36.9 C)  97.9 F (36.6 C)   TempSrc: Oral  Oral   SpO2: 98%  97%   Weight:    106 kg  Height:    5\' 4"  (1.626 m)   Physical Exam  Constitutional: She is oriented to person, place, and time and well-developed, well-nourished, and in no distress. No distress.  HENT:  Head: Normocephalic and atraumatic.  Mouth/Throat: No oropharyngeal exudate.  Eyes: Pupils are equal, round, and reactive to light. Conjunctivae and EOM are normal. No scleral icterus.  Cardiovascular: Normal rate, regular rhythm, normal heart sounds and intact distal pulses.  Pulmonary/Chest: Effort normal. No respiratory distress. She has no wheezes. She has no rales.  Abdominal: Soft. Bowel sounds are normal. She exhibits no distension. There is no tenderness. There is no guarding.  Musculoskeletal: Normal range of motion. She exhibits edema (Non-pitting edema bilateral lower extremities) and tenderness. She exhibits no deformity.  Bilateral lower extremity ulcers wrapped in bandaging. Warm to touch. Did not remove bandaging to inspect  Neurological: She is alert and  oriented to person, place, and time. No cranial nerve deficit. Gait normal. GCS score is 15.  Skin: Skin is warm and dry. She is not diaphoretic.    Assessment/Plan:  Principal Problem:   Wound infection Active Problems:   Venous stasis ulcers (HCC)   Normocytic anemia   DJD (degenerative joint disease)   S/P total knee arthroplasty, bilateral   History of DVT of lower extremity   Rheumatoid arthritis involving multiple joints (HCC)   Permanent atrial fibrillation (HCC)   Morbid obesity (HCC)  Shirley Waters is a 77 yo F w/ PMH of SLE, RA, Chronic venous insufficeincy and DJD s/p bilateral knee replacements presenting to the hospital for bilateral chronic ulcers. She was evaluated by vascular surgery who recommend OR debridement. Coumadin stopped and INR down to 1.78 today. She will be going to OR today for debridement  Lower extremity ulcers 2/2 venous stasis Known Venous stasis since 2007. Treated with compression stockings. Started developing ulcers recently. Left sided ulcer in 09/2017 requiring IV abx and admission. Right side ulcer developed during SNF post discharge and has been enlarging progressively despite wound care at her SNF. Her ulcers may have delayed healing due to her prednisone for her RA. ABI results from 01/2018 show no arterial occlusion and no significant stenosis. - F/u Blood Cultures (pending: pmns, rare gram + cocci) - C/w Vanc/Cefepime per ID - Appreciate ID recs - Oxycodone 10mg  q6 PRN for pain - Dressing changes per wound care - Resume Coumadin  after OR debridement today - C/w Lasix 40mg  PO  A.fib - Resume Coumadin after OR debridement today - Rate control w/ metoprolol 50mg  BID  RA/SLE - c/w home meds: Plaquenil 200mg  Bid, Prednisone 5mg  daily with additional 5mg  PRN for arthritic pain  Hyperthyroidism - C/w home med: methimazole 7.5mg   DVT prophx:Coumadin Diet: Regular Bowel: Senokot Code: FULL  Dispo: Anticipated discharge in approximately  2 day(s).   , MD 03/03/2018, 12:22 PM Pager: [phone]

## 2018-03-03 NOTE — Progress Notes (Signed)
Patient ID: Shirley Waters, female   DOB: 02-13-1941, 77 y.o.   MRN: [id number]         [facility]  Date of Admission:  02/28/2018   Total days of antibiotics 4        Day 3 vancomycin        Day 3 cefepime         ASSESSMENT: She is scheduled for debridement of her lower extremity venous stasis ulcers today.  I plan on continuing current antibiotic therapy through the weekend.  PLAN: 1. Continue current antibiotics 2. Please call Dr. Merceda Elks [phone] for any infectious disease questions this weekend  Principal Problem:   Wound infection Active Problems:   Venous stasis ulcers (HCC)   Normocytic anemia   DJD (degenerative joint disease)   S/P total knee arthroplasty, bilateral   History of DVT of lower extremity   Rheumatoid arthritis involving multiple joints (HCC)   Permanent atrial fibrillation (HCC)   Morbid obesity (HCC)   Scheduled Meds: . furosemide  40 mg Oral Daily  . hydroxychloroquine  200 mg Oral BID  . methimazole  7.5 mg Oral Daily  . metoprolol tartrate  50 mg Oral BID  . predniSONE  5 mg Oral Q breakfast  . senna  1 tablet Oral BID   Continuous Infusions: . ceFEPime (MAXIPIME) IV 200 mL/hr at 03/02/18 1600  . vancomycin 1,500 mg (03/02/18 1800)   PRN Meds:.acetaminophen **OR** acetaminophen, ondansetron **OR** ondansetron (ZOFRAN) IV, oxyCODONE, polyethylene glycol, predniSONE   SUBJECTIVE: She is worried about having pain after surgery today.  Review of Systems: Review of Systems  Constitutional: Negative for chills, fever and malaise/fatigue.  Musculoskeletal: Positive for joint pain.    Allergies  Allergen Reactions  . Latex Rash  . Levofloxacin Palpitations    OBJECTIVE: Vitals:   03/02/18 1511 03/02/18 1951 03/02/18 2244 03/03/18 0432  BP: (!) 103/45 (!) 117/54 (!) 110/51 (!) 125/53  Pulse: 66 67 66 69  Resp:  16  16  Temp: 98.6 F (37 C) 98.4 F (36.9 C)  97.9 F (36.6 C)  TempSrc: Oral Oral   Oral  SpO2: 100% 98%  97%  Weight:      Height:       Body mass index is 40.1 kg/m.  Physical Exam  Constitutional: She is oriented to person, place, and time.  She is talkative and in good spirits.  She is sitting up in a chair with her legs elevated.  Neurological: She is alert and oriented to person, place, and time.  Skin:  There is yellow drainage on her right leg gauze dressing.  Her left leg dressing is clean and dry.  Psychiatric: She has a normal mood and affect.    Lab Results Lab Results  Component Value Date   WBC 7.4 03/03/2018   HGB 8.7 (L) 03/03/2018   HCT 29.6 (L) 03/03/2018   MCV 88.1 03/03/2018   PLT 331 03/03/2018    Lab Results  Component Value Date   CREATININE 0.97 03/03/2018   BUN 11 03/03/2018   NA 137 03/03/2018   K 4.2 03/03/2018   CL 99 03/03/2018   CO2 34 (H) 03/03/2018    Lab Results  Component Value Date   ALT 9 02/28/2018   AST 20 02/28/2018   ALKPHOS 65 02/28/2018   BILITOT 0.7 02/28/2018     Microbiology: Recent Results (from the past 240 hour(s))  Culture, blood (routine x 2)  Status: None (Preliminary result)   Collection Time: 02/28/18  2:59 PM  Result Value Ref Range Status   Specimen Description BLOOD RIGHT WRIST  Final   Special Requests   Final    BOTTLES DRAWN AEROBIC AND ANAEROBIC Blood Culture results may not be optimal due to an inadequate volume of blood received in culture bottles   Culture   Final    NO GROWTH 2 DAYS Performed at [facility], [address] [place], [place] [address]    Report Status PENDING  Incomplete  Culture, blood (routine x 2)     Status: None (Preliminary result)   Collection Time: 02/28/18  3:38 PM  Result Value Ref Range Status   Specimen Description BLOOD SITE NOT SPECIFIED  Final   Special Requests   Final    BOTTLES DRAWN AEROBIC AND ANAEROBIC Blood Culture results may not be optimal due to an excessive volume of blood received in culture bottles   Culture   Final     NO GROWTH 2 DAYS Performed at [facility], [address], [place], [place] [address]    Report Status PENDING  Incomplete  Surgical pcr screen     Status: None   Collection Time: 03/03/18  4:53 AM  Result Value Ref Range Status   MRSA, PCR NEGATIVE NEGATIVE Final   Staphylococcus aureus NEGATIVE NEGATIVE Final    Comment: (NOTE) The Xpert SA Assay (FDA approved for NASAL specimens in patients 38 years of age and older), is one component of a comprehensive surveillance program. It is not intended to diagnose infection nor to guide or monitor treatment. Performed at [facility], [address] [place], [place] [address]     Cliffton Asters, MD [facility] [phone] pager   [phone] cell 03/03/2018, 9:48 AM

## 2018-03-03 NOTE — Plan of Care (Signed)
  Problem: Education: Goal: Knowledge of General Education information will improve Description Including pain rating scale, medication(s)/side effects and non-pharmacologic comfort measures Outcome: Progressing   Problem: Clinical Measurements: Goal: Ability to maintain clinical measurements within normal limits will improve Outcome: Progressing   Problem: Clinical Measurements: Goal: Will remain free from infection Outcome: Progressing   Problem: Activity: Goal: Risk for activity intolerance will decrease Outcome: Progressing   Problem: Pain Managment: Goal: General experience of comfort will improve Outcome: Progressing   Problem: Safety: Goal: Ability to remain free from injury will improve Outcome: Progressing   

## 2018-03-03 NOTE — Progress Notes (Signed)
Internal Medicine Attending  Date: 03/03/2018  Patient name: Shirley Waters Medical record number: [id number] Date of birth: 1941-03-30 Age: 77 y.o. Gender: female  I saw and evaluated the patient. I reviewed the resident's note by Dr. Nedra Hai and I agree with the resident's findings and plans as documented in his progress note.  When seen on rounds this morning Shirley Waters was preoperative. She had no acute complaints and appreciated the information as to when her surgery was scheduled. We are continuing the antibiotics through the weekend per Infectious Disease recommendations. We will also restart the warfarin this evening. If the debridement went well and the antibiotics are effective I anticipate discharge with outpatient wound management early next week.

## 2018-03-03 NOTE — Op Note (Signed)
    Patient name: Shirley Waters MRN: [id number] DOB: 1940/09/29 Sex: female  03/03/2018 Pre-operative Diagnosis: Bilateral venous stasis ulcers Post-operative diagnosis:  Same Surgeon:  Durene Cal Assistants: Nurse Procedure:   Debridement of bilateral venous stasis ulcers including skin and subcutaneous tissue. Anesthesia: General Blood Loss: 100 cc Specimens: A biopsy of the wound on the right leg was taken and sent to pathology.  Cultures of the right leg wounds were sent to microbiology.  Findings: Healthy bleeding tissue was encountered after removing the biofilm and slough of the ulcers.  Indications: The patient has had chronic nonhealing venous stasis ulcers bilaterally.  The right leg has multiple ulcers in the left leg as one remaining ulcer.  I did not feel that she could heal these without debridement and so she comes in today for her procedure.  She is on Coumadin for atrial fibrillation.  Her INR was 1.78 today.  Procedure:  The patient was identified in the holding area and taken to [facility] ROOM 11  The patient was then placed supine on the table. general anesthesia was administered.  The patient was prepped and draped in the usual sterile fashion.  A time out was called and antibiotics were administered.  I first began on the left leg.  She had an ulcer that was approximately 3 x 5 cm and very superficial.  There was a yellow film over top of the ulcer.  I used a 10 blade to scrape away all of the nonviable tissue and got down to healthy bleeding.  Next attention was turned towards the right leg.  Patient had 4 ulcers that were not contiguous on the right leg.  They were all covered with a yellow biofilm.  I used a 10 blade to scrape away all of the nonviable tissue until I was able to get down to healthy bleeding.  I did take a biopsy of the larger wound on the anterior lateral leg and sent this to pathology.  I also obtained cultures for better microorganism detection.  Once all the  nonviable tissue was removed, I held pressure for hemostasis.  I then placed Xeroform over the wounds followed by 4 x 4's Kerlix, Ace wrap and Covan.  The patient tolerated the procedure well there were no immediate complications.   Disposition: To PACU stable   V. Durene Cal, M.D. [facility] [place] Office: [phone] Pager:  [phone]

## 2018-03-03 NOTE — Anesthesia Procedure Notes (Signed)
Procedure Name: LMA Insertion Date/Time: 03/03/2018 12:50 PM Performed by: Shireen Quan, CRNA Pre-anesthesia Checklist: Patient identified, Emergency Drugs available, Suction available, Patient being monitored and Timeout performed Patient Re-evaluated:Patient Re-evaluated prior to induction Oxygen Delivery Method: Circle system utilized Preoxygenation: Pre-oxygenation with 100% oxygen Induction Type: IV induction LMA: LMA with gastric port inserted LMA Size: 4.0 Number of attempts: 1 Placement Confirmation: positive ETCO2,  CO2 detector and breath sounds checked- equal and bilateral Tube secured with: Tape Dental Injury: Teeth and Oropharynx as per pre-operative assessment  Comments: LMA inserted by Wolfgang Phoenix

## 2018-03-04 ENCOUNTER — Other Ambulatory Visit: Payer: Self-pay

## 2018-03-04 ENCOUNTER — Encounter (HOSPITAL_COMMUNITY): Payer: Self-pay | Admitting: Surgery

## 2018-03-04 LAB — CBC
HCT: 30.2 % — ABNORMAL LOW (ref 36.0–46.0)
Hemoglobin: 9 g/dL — ABNORMAL LOW (ref 12.0–15.0)
MCH: 26.2 pg (ref 26.0–34.0)
MCHC: 29.8 g/dL — ABNORMAL LOW (ref 30.0–36.0)
MCV: 88 fL (ref 78.0–100.0)
PLATELETS: 343 10*3/uL (ref 150–400)
RBC: 3.43 MIL/uL — AB (ref 3.87–5.11)
RDW: 14.7 % (ref 11.5–15.5)
WBC: 8.9 10*3/uL (ref 4.0–10.5)

## 2018-03-04 LAB — PROTIME-INR
INR: 1.43
PROTHROMBIN TIME: 17.3 s — AB (ref 11.4–15.2)

## 2018-03-04 MED ORDER — OXYCODONE HCL 5 MG PO TABS
15.0000 mg | ORAL_TABLET | Freq: Four times a day (QID) | ORAL | Status: DC | PRN
  Administered 2018-03-04 – 2018-03-07 (×8): 15 mg via ORAL
  Filled 2018-03-04 (×9): qty 3

## 2018-03-04 MED ORDER — WARFARIN SODIUM 7.5 MG PO TABS
7.5000 mg | ORAL_TABLET | Freq: Once | ORAL | Status: AC
  Administered 2018-03-04: 7.5 mg via ORAL
  Filled 2018-03-04: qty 1

## 2018-03-04 MED ORDER — HYDROMORPHONE HCL 1 MG/ML IJ SOLN
0.5000 mg | INTRAMUSCULAR | Status: DC | PRN
  Administered 2018-03-04 – 2018-03-06 (×9): 0.5 mg via INTRAVENOUS
  Filled 2018-03-04 (×9): qty 1

## 2018-03-04 NOTE — Progress Notes (Signed)
Subjective:  Shirley Waters is a 77 yo F w/ PMH of SLE, RA, Chronic venous insufficeincy and DJD s/p bilateral knee replacements presenting to the hospital for bilateral chronic ulcers on admit day4  Objective: Shirley Waters was examined and evaluated in her room sitting in her chair this AM. She states she had a horrible night yesterday after surgery 2/2 inadequate pain control. She states initially her legs and toes felt too tight and asked the night nurse to rewrap her bandages. She states she was crying overnight because the pain was unbearable. Currently she still feels throbbing pain but it is tolerable. Endorsing appetite. Able to eliminate and void normally. Spoke with patient about her INR and necessity of bridging. She states she is very aware of her Coumadin dosing as she checks her INR at home weekly. She states she would rather not risk re-bleeding and would like to wait for her INR to come up on Coumadin alone. The risks and dangers of thrombosis were explained to her but she states her priority is her recent debridement and making sure there is no complications with that. Denies any F/N/V/D/C.  Vital signs in last 24 hours: Vitals:   03/03/18 2012 03/03/18 2339 03/04/18 0429 03/04/18 0852  BP: 135/69 (!) 141/66 (!) 143/61 (!) 143/61  Pulse:  72 74 74  Resp:   18   Temp:  99.5 F (37.5 C) 99.3 F (37.4 C)   TempSrc:  Oral Oral   SpO2:  100% 99%   Weight:      Height:       Physical Exam  Constitutional: She is oriented to person, place, and time and well-developed, well-nourished, and in no distress. No distress.  HENT:  Head: Normocephalic and atraumatic.  Mouth/Throat: Oropharynx is clear and moist. No oropharyngeal exudate.  Eyes: Pupils are equal, round, and reactive to light. Conjunctivae and EOM are normal. No scleral icterus.  Neck: Normal range of motion. Neck supple. No JVD present. No thyromegaly present.  Cardiovascular: Normal rate, regular rhythm, normal heart  sounds and intact distal pulses.  Pulmonary/Chest: Effort normal. No respiratory distress. She has no wheezes. She has no rales.  Abdominal: Soft. Bowel sounds are normal. She exhibits no distension. There is no tenderness. There is no guarding.  Musculoskeletal: Normal range of motion. She exhibits tenderness and deformity. She exhibits no edema.  Bilateral lower extremity ulcers covered with bandaging. Did not remove to inspect. No obvious increased edema or warmth  Neurological: She is alert and oriented to person, place, and time. No cranial nerve deficit. GCS score is 15.  Skin: Skin is warm and dry. She is not diaphoretic.  Psychiatric: Mood, memory, affect and judgment normal.   Assessment/Plan:  Principal Problem:   Wound infection Active Problems:   Venous stasis ulcers (HCC)   Normocytic anemia   DJD (degenerative joint disease)   S/P total knee arthroplasty, bilateral   History of DVT of lower extremity   Rheumatoid arthritis involving multiple joints (HCC)   Permanent atrial fibrillation (HCC)   Morbid obesity (HCC)  Shirley Waters is a 77 yo F w/ PMH of SLE, RA, Chronic venous insufficeincy and DJD s/p bilateral knee replacements presenting to the hospital for bilateral chronic ulcers.She was evaluated by vascular surgery who performed OR debridement on 03/03/18 without complications. Coumadin stopped for OR debridement, but restarted yesterday. Current INR sub-therapeutic at 1.43.  Lower extremity ulcers 2/2 venous stasis Known Venous stasis since 2007. Treated with compression stockings. Started developing ulcers recently. Left sided  ulcer in 09/2017 requiring IV abx and admission. Right side ulcer developed during SNF post discharge and has been enlarging progressively despite wound care at her SNF. Her ulcers may have delayed healing due to her prednisone for her RA. Arterial doppler results from 01/2018 show no arterial occlusion and no significant stenosis. Blood culture show  no growth - F/u Wound Cultures (pending: pmns, rare gram + cocci) -C/wVanc/Cefepime per ID - Appreciate ID recs - Increase to Oxycodone 15mg  q6 PRN for pain - Dressing changes per wound care - C/w Lasix 40mg  PO  A.fib - C/w Warfarin per pharm dosing - Rate control w/ metoprolol 50mg  BID  RA/SLE - c/w home meds: Plaquenil 200mg  Bid, Prednisone 5mg  dailywith additional 5mg  PRN for arthritic pain  Hyperthyroidism - C/w home med: methimazole 7.5mg   DVT prophx:Coumadin Diet: Regular Bowel: Senokot Code: FULL  Dispo: Anticipated discharge in approximately 1 day(s).   , MD 03/04/2018, 10:38 AM Pager: [phone]

## 2018-03-04 NOTE — Progress Notes (Signed)
  Progress Note    03/04/2018 11:33 AM 1 Day Post-Op  Subjective: States she was crying in pain all night  Vitals:   03/04/18 0429 03/04/18 0852  BP: (!) 143/61 (!) 143/61  Pulse: 74 74  Resp: 18   Temp: 99.3 F (37.4 C)   SpO2: 99%     Physical Exam: Awake alert oriented Bilateral lower extremity dressings are clean dry and intact  CBC    Component Value Date/Time   WBC 8.9 03/04/2018 0352   RBC 3.43 (L) 03/04/2018 0352   HGB 9.0 (L) 03/04/2018 0352   HCT 30.2 (L) 03/04/2018 0352   PLT 343 03/04/2018 0352   MCV 88.0 03/04/2018 0352   MCH 26.2 03/04/2018 0352   MCHC 29.8 (L) 03/04/2018 0352   RDW 14.7 03/04/2018 0352   LYMPHSABS 1.7 02/28/2018 1246   MONOABS 0.7 02/28/2018 1246   EOSABS 0.4 02/28/2018 1246   BASOSABS 0.0 02/28/2018 1246    BMET    Component Value Date/Time   NA 137 03/03/2018 0432   K 4.2 03/03/2018 0432   CL 99 03/03/2018 0432   CO2 34 (H) 03/03/2018 0432   GLUCOSE 87 03/03/2018 0432   BUN 11 03/03/2018 0432   CREATININE 0.97 03/03/2018 0432   CALCIUM 8.5 (L) 03/03/2018 0432   GFRNONAA 55 (L) 03/03/2018 0432   GFRAA >60 03/03/2018 0432    INR    Component Value Date/Time   INR 1.43 03/04/2018 0352     Intake/Output Summary (Last 24 hours) at 03/04/2018 1133 Last data filed at 03/04/2018 5732 Gross per 24 hour  Intake 975 ml  Output 475 ml  Net 500 ml     Assessment:  77 y.o. female is s/p bilateral lower extremity extensive venous stasis ulcers skin and subcutaneous tissue  Plan: Antibiotics per ID Patient wishes to have dressing changes tomorrow I will take him down she will likely be candidate for Unna boots Oxycodone has been increased by primary team and I have given her Dilaudid breakthrough as she states morphine does not work for her in the past. We will continue to follow.   Delinda Malan C. Randie Heinz, MD [facility] [place] Office: [phone] Pager: [phone]  03/04/2018 11:33 AM

## 2018-03-04 NOTE — Progress Notes (Signed)
Patient c/o bil leg pain. States that dressing to bil legs are too tight. Dressing to upper  Rt leg noted to be coming off and patient denies loosening it. Able to move bil legs, toes warm to touch, no swelling noted. Ace wrap/coban removed and re-wrapped lightly on Bil legs. No drainage/bleeding noted to dressing. Patient verbalize some relief but continues to c/o of sharp/tingling pain to bil feet. Bil legs elevated. Medicated with PRN pain meds.Will cont to monitor.

## 2018-03-04 NOTE — Discharge Instructions (Addendum)
Shirley Waters,  You came to [place] with complaints of leg wounds. We had out infectious disease specialist assess your wounds and give [place] recommendations on your antibiotic regimen. We also had our vascular surgeons assess your legs and they took you to the OR for cleaning the wounds. We have put on Unna boots for you and we believe you are ready for discharge. Our case manager will arrange a home health nurse to come and manage your wound care. Here are our recommendations for you at discharge:  START elevating your feet as often as possible START taking furosemid (Lasix) 40mg  every day instead of as needed  Try to keep your lower legs as clean as possible and diligently go to your wound care center after home health nursing is complete.  Thank you for choosing [facility].  Information on my medicine - Coumadin   (Warfarin)  Why was Coumadin prescribed for you? Coumadin was prescribed for you because you have a blood clot or a medical condition that can cause an increased risk of forming blood clots. Blood clots can cause serious health problems by blocking the flow of blood to the heart, lung, or brain. Coumadin can prevent harmful blood clots from forming. As a reminder your indication for Coumadin is:   Stroke Prevention Because Of Atrial Fibrillation  What test will check on my response to Coumadin? While on Coumadin (warfarin) you will need to have an INR test regularly to ensure that your dose is keeping you in the desired range. The INR (international normalized ratio) number is calculated from the result of the laboratory test called prothrombin time (PT).  If an INR APPOINTMENT HAS NOT ALREADY BEEN MADE FOR YOU please schedule an appointment to have this lab work done by your health care provider within 7 days. Your INR goal is usually a number between:  2 to 3 or your provider may give you a more narrow range like 2-2.5.  Ask your health care provider during an office visit what your goal  INR is.  What  do you need to  know  About  COUMADIN? Take Coumadin (warfarin) exactly as prescribed by your healthcare provider about the same time each day.  DO NOT stop taking without talking to the doctor who prescribed the medication.  Stopping without other blood clot prevention medication to take the place of Coumadin may increase your risk of developing a new clot or stroke.  Get refills before you run out.  What do you do if you miss a dose? If you miss a dose, take it as soon as you remember on the same day then continue your regularly scheduled regimen the next day.  Do not take two doses of Coumadin at the same time.  Important Safety Information A possible side effect of Coumadin (Warfarin) is an increased risk of bleeding. You should call your healthcare provider right away if you experience any of the following: ? Bleeding from an injury or your nose that does not stop. ? Unusual colored urine (red or dark brown) or unusual colored stools (red or black). ? Unusual bruising for unknown reasons. ? A serious fall or if you hit your head (even if there is no bleeding).  Some foods or medicines interact with Coumadin (warfarin) and might alter your response to warfarin. To help avoid this: ? Eat a balanced diet, maintaining a consistent amount of Vitamin K. ? Notify your provider about major diet changes you plan to make. ? Avoid alcohol  or limit your intake to 1 drink for women and 2 drinks for men per day. (1 drink is 5 oz. wine, 12 oz. beer, or 1.5 oz. liquor.)  Make sure that ANY health care provider who prescribes medication for you knows that you are taking Coumadin (warfarin).  Also make sure the healthcare provider who is monitoring your Coumadin knows when you have started a new medication including herbals and non-prescription products.  Coumadin (Warfarin)  Major Drug Interactions  Increased Warfarin Effect Decreased Warfarin Effect  Alcohol (large  quantities) Antibiotics (esp. Septra/Bactrim, Flagyl, Cipro) Amiodarone (Cordarone) Aspirin (ASA) Cimetidine (Tagamet) Megestrol (Megace) NSAIDs (ibuprofen, naproxen, etc.) Piroxicam (Feldene) Propafenone (Rythmol SR) Propranolol (Inderal) Isoniazid (INH) Posaconazole (Noxafil) Barbiturates (Phenobarbital) Carbamazepine (Tegretol) Chlordiazepoxide (Librium) Cholestyramine (Questran) Griseofulvin Oral Contraceptives Rifampin Sucralfate (Carafate) Vitamin K   Coumadin (Warfarin) Major Herbal Interactions  Increased Warfarin Effect Decreased Warfarin Effect  Garlic Ginseng Ginkgo biloba Coenzyme Q10 Green tea St. Johns wort    Coumadin (Warfarin) FOOD Interactions  Eat a consistent number of servings per week of foods HIGH in Vitamin K (1 serving =  cup)  Collards (cooked, or boiled & drained) Kale (cooked, or boiled & drained) Mustard greens (cooked, or boiled & drained) Parsley *serving size only =  cup Spinach (cooked, or boiled & drained) Swiss chard (cooked, or boiled & drained) Turnip greens (cooked, or boiled & drained)  Eat a consistent number of servings per week of foods MEDIUM-HIGH in Vitamin K (1 serving = 1 cup)  Asparagus (cooked, or boiled & drained) Broccoli (cooked, boiled & drained, or raw & chopped) Brussel sprouts (cooked, or boiled & drained) *serving size only =  cup Lettuce, raw (green leaf, endive, romaine) Spinach, raw Turnip greens, raw & chopped   These websites have more information on Coumadin (warfarin):  http://www.king-russell.com/; https://www.hines.net/;

## 2018-03-04 NOTE — Progress Notes (Signed)
ANTIBIOTIC CONSULT NOTE - INITIAL  Pharmacy Consult for Vanco, Cefepime, warfarin Indication: B LE ulcers and afib  Allergies  Allergen Reactions  . Latex Rash  . Levofloxacin Palpitations    Patient Measurements: Height: 5\' 4"  (162.6 cm) Weight: 233 lb 9.6 oz (106 kg) IBW/kg (Calculated) : 54.7 Adjusted Body Weight:    Vital Signs: Temp: 99.3 F (37.4 C) (08/24 0429) Temp Source: Oral (08/24 0429) BP: 143/61 (08/24 0429) Pulse Rate: 74 (08/24 0429) Intake/Output from previous day: 08/23 0701 - 08/24 0700 In: 975 [I.V.:800] Out: 475 [Urine:400; Blood:75] Intake/Output from this shift: No intake/output data recorded.  Labs: Recent Labs    03/02/18 0425 03/03/18 0432 03/04/18 0352  WBC 7.0 7.4 8.9  HGB 8.6* 8.7* 9.0*  PLT 308 331 343  CREATININE 1.12* 0.97  --    Estimated Creatinine Clearance: 58.6 mL/min (by C-G formula based on SCr of 0.97 mg/dL). No results for input(s): VANCOTROUGH, VANCOPEAK, VANCORANDOM, GENTTROUGH, GENTPEAK, GENTRANDOM, TOBRATROUGH, TOBRAPEAK, TOBRARND, AMIKACINPEAK, AMIKACINTROU, AMIKACIN in the last 72 hours.   Microbiology:   Medical History: Past Medical History:  Diagnosis Date  . Atrial fibrillation (HCC)   . CHF (congestive heart failure) (HCC)   . Chronic pain   . COPD (chronic obstructive pulmonary disease) (HCC)   . 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 DVT (deep venous thrombosis) (HCC)    history of, RLE  . Hypertension   . RA (rheumatoid arthritis) (HCC)    Assessment: Anticoag: warfarin for afib. Held for operative debridement of venous ulcers. INR 3.52>1.78>1.43 (Vit K 1mg  IV on 8/22).  Hgb 9 stable. Plts stable. Coum resume 8/23. - PTA dose 5mg  daily, admit INR 2.94  ID: LE ulcers 2/2 venous insufficiency. LA 2.59>1.3>1.6 up, I&D B LE ulcers done 8/23.Culture data from [facility] - MRSA in wound and pseudomonas - ID changed ABX to Vanc/Cefepime - Afebrile, WBC WNL - Old wound cultures in February 2019 grew out  MRSA Proteus and Klebsiella.   Doxy 8/20>>(8/21) 8/21 Vanc >> 8/21 Cefepime>>  8/20 Bcx>> 8/23: leg tissue: GPC 8/23: MRSA PCR: negative  Goal of Therapy:  Vancomycin trough level 10-15 mcg/ml  Plan:  Warfarin 7.5mg  po x 1 tonight. Daily INR Vanc change to 750mg  IV q 12hrs Cefepime 2gm change to q 12 hrs-  Continue abx per ID through the weekend. Hold RA meds with infection?    Shirley Waters S. 9/20, PharmD, BCPS Clinical Staff Pharmacist Pager [phone]  9/23 [place] 03/04/2018,7:41 AM

## 2018-03-04 NOTE — Progress Notes (Signed)
Nutrition Brief Note  Patient identified on the Malnutrition Screening Tool (MST) Report.  Patient admitted with Cellulitis of lower extremity, unspecified laterality [L03.119].  Wt Readings from Last 15 Encounters:  03/03/18 106 kg  10/12/17 114.8 kg  04/07/15 119.9 kg   Body mass index is 40.1 kg/m. Patient meets criteria for Obesity Class III based on current BMI.   Current diet order is Regular, patient is consuming approximately 100% of meals at this time. Labs and medications reviewed.   No nutrition interventions warranted at this time. If nutrition issues arise, please consult RD.   Maureen Chatters, RD, LDN Pager #: [phone] After-Hours Pager #: [phone]

## 2018-03-04 NOTE — Progress Notes (Signed)
[facility]  [facility] Infusion Coordinator will follow pt hospital course with ID team to support home infusion needs at DC if ordered in partnership with [facility] agency in [place], [place] area as needed.  If patient discharges after hours, please call [phone].   Sedalia Muta 03/04/2018, 8:24 AM

## 2018-03-05 LAB — CULTURE, BLOOD (ROUTINE X 2)
Culture: NO GROWTH
Culture: NO GROWTH

## 2018-03-05 LAB — CBC
HCT: 28.7 % — ABNORMAL LOW (ref 36.0–46.0)
Hemoglobin: 8.7 g/dL — ABNORMAL LOW (ref 12.0–15.0)
MCH: 26.6 pg (ref 26.0–34.0)
MCHC: 30.3 g/dL (ref 30.0–36.0)
MCV: 87.8 fL (ref 78.0–100.0)
Platelets: 300 10*3/uL (ref 150–400)
RBC: 3.27 MIL/uL — ABNORMAL LOW (ref 3.87–5.11)
RDW: 14.6 % (ref 11.5–15.5)
WBC: 7.7 10*3/uL (ref 4.0–10.5)

## 2018-03-05 LAB — PROTIME-INR
INR: 1.49
PROTHROMBIN TIME: 17.9 s — AB (ref 11.4–15.2)

## 2018-03-05 MED ORDER — HYDROMORPHONE HCL 1 MG/ML IJ SOLN
1.0000 mg | Freq: Four times a day (QID) | INTRAMUSCULAR | Status: DC | PRN
  Administered 2018-03-05 (×2): 1 mg via INTRAVENOUS
  Filled 2018-03-05 (×2): qty 1

## 2018-03-05 MED ORDER — WARFARIN SODIUM 6 MG PO TABS
6.0000 mg | ORAL_TABLET | Freq: Once | ORAL | Status: AC
  Administered 2018-03-05: 6 mg via ORAL
  Filled 2018-03-05: qty 1

## 2018-03-05 NOTE — Progress Notes (Signed)
MD at the bedside for dressing change.  Patient appears to be in a lot of pain due to dressing change.  Medicated for pain as ordered.  IV infiltrate/ IV team consult done. Dressing completed without complications.

## 2018-03-05 NOTE — Progress Notes (Signed)
Orthopedic Tech Progress Note Patient Details:  Shirley Waters 09-18-1940 [id number]  Patient ID: Shirley Waters, female   DOB: 1941/04/27, 77 y.o.   MRN: [id number]   Shirley Waters 03/05/2018, 11:40 AMOut of unna boots

## 2018-03-05 NOTE — Progress Notes (Signed)
ANTICOAGULATION CONSULT NOTE - Follow Up Consult  Pharmacy Consult for Coumadin Indication: atrial fibrillation  Allergies  Allergen Reactions  . Latex Rash  . Levofloxacin Palpitations    Patient Measurements: Height: 5\' 4"  (162.6 cm) Weight: 233 lb 9.6 oz (106 kg) IBW/kg (Calculated) : 54.7  Vital Signs: Temp: 98.1 F (36.7 C) (08/25 0502) Temp Source: Oral (08/25 0502) BP: 116/47 (08/25 0502) Pulse Rate: 56 (08/25 0502)  Labs: Recent Labs    03/03/18 0432 03/04/18 0352 03/05/18 0614  HGB 8.7* 9.0* 8.7*  HCT 29.6* 30.2* 28.7*  PLT 331 343 300  LABPROT 20.5* 17.3* 17.9*  INR 1.78 1.43 1.49  CREATININE 0.97  --   --     Estimated Creatinine Clearance: 58.6 mL/min (by C-G formula based on SCr of 0.97 mg/dL).  Assessment:  Anticoag: warfarin for afib. Held for operative debridement of venous ulcers. INR 1.49 (Vit K 1mg  IV on 8/22).  Hgb 8.7 stable. Plts stable. Coum resumed 8/23. - PTA dose 5mg  daily, admit INR 2.94  Goal of Therapy:  INR 2-3 Monitor platelets by anticoagulation protocol: Yes   Plan:  Coumadin 6mg  po x 1 tonight Daily INR  Laksh Hinners S. 9/22, PharmD, BCPS Clinical Staff Pharmacist Pager [phone]  [place] 03/05/2018,7:59 AM

## 2018-03-05 NOTE — Progress Notes (Signed)
Internal Medicine Attending:   I saw and examined the patient. I reviewed the resident's note and I agree with the resident's findings and plan as documented in the resident's note.  Patient complains of pain in both her legs today at the site of her debridement.  Lower extremity dressing is intact.  Patient initially admitted to the hospital with lower extremity ulcers secondary to venous stasis and is now status post debridement postop day #2.  Vascular surgery follow-up and recommendations appreciated.  Will continue with pain control for now.  We will follow-up wound cultures.  Gram stain with a few gram-positive cocci.  Continue with IV vancomycin and cefepime for now per ID.  Anticipate the patient will likely be transitioned to oral antibiotics and pain meds early next week with likely outpatient follow-up for wound management.

## 2018-03-05 NOTE — Progress Notes (Signed)
Subjective:  Shirley Waters is a 77 yo F w/ PMH of SLE, RA, Chronic venous insufficeincy and DJD s/p bilateral knee replacements presenting to the hospital for bilateral chronic ulcers on admit day5  Shirley Waters was examined and evaluated at her room sitting in her chair this AM. She was tearful on exam and explained she was tired of her legs hurting. She states her PRN Dilaudid was not helpful at all. She also complains about hospital food being bad and difficulty in putting on her shoes due to her foot deformities caused by rheumatic arthritis. She states she wants to walk. She states she will feel better eventually but just not feeling good at the moment. Denies any F/N/V/D/C  Objective:  Vital signs in last 24 hours: Vitals:   03/04/18 1958 03/04/18 2135 03/05/18 0502 03/05/18 0944  BP: (!) 113/41 (!) 116/48 (!) 116/47 (!) 116/47  Pulse: 72 69 (!) 56 (!) 56  Resp: 16  20   Temp: 100.1 F (37.8 C) 98.3 F (36.8 C) 98.1 F (36.7 C)   TempSrc: Oral Oral Oral   SpO2: 97% 100% 99%   Weight:      Height:       Physical Exam  Constitutional: She is oriented to person, place, and time. She appears distressed.  Tearful  HENT:  Head: Normocephalic and atraumatic.  Eyes: Pupils are equal, round, and reactive to light. Conjunctivae and EOM are normal. No scleral icterus.  Neck: Normal range of motion. Neck supple. No JVD present. No tracheal deviation present. No thyromegaly present.  Cardiovascular: Normal rate, regular rhythm, normal heart sounds and intact distal pulses.  Pulmonary/Chest: Effort normal and breath sounds normal. No respiratory distress. She has no wheezes. She has no rales.  Abdominal: Soft. Bowel sounds are normal. She exhibits no distension. There is no tenderness. There is no guarding.  Musculoskeletal: Normal range of motion. She exhibits edema, tenderness and deformity (Rheumatic changes to fingers and toes).  Lower extremity ulcers covered with bandaging. Did not  remove to inspect  Neurological: She is alert and oriented to person, place, and time. No cranial nerve deficit. GCS score is 15.  Skin: Skin is warm and dry. She is not diaphoretic.  Psychiatric: Memory and judgment normal.  Sad affect    Assessment/Plan:  Principal Problem:   Wound infection Active Problems:   Venous stasis ulcers (HCC)   Normocytic anemia   DJD (degenerative joint disease)   S/P total knee arthroplasty, bilateral   History of DVT of lower extremity   Rheumatoid arthritis involving multiple joints (HCC)   Permanent atrial fibrillation (HCC)   Morbid obesity (HCC)  Shirley Waters is a 77 yo F w/ PMH of SLE, RA, Chronic venous insufficeincy and DJD s/p bilateral knee replacements presenting to the hospital for bilateral chronic ulcers.She was evaluated by vascular surgery who performed OR debridement on 03/03/18 without complications.Coumadin stopped for OR debridement, but restarted on 03/03/18. Current INR sub-therapeutic at 1.49.  Lower extremity ulcers 2/2 venous stasis Known Venous stasis since 2007. Treated with compression stockings. Started developing ulcers recently. Left sided ulcer in 09/2017 requiring IV abx and admission. Right side ulcer developed during SNF post discharge and has been enlarging progressively despite wound care at her SNF. Her ulcers may have delayed healing due to her prednisone for her RA.Arterial doppler results from 01/2018 show no arterial occlusion and no significant stenosis. Blood culture show no growth. WBC 7.7 - F/u Wound Cultures(pending: pmns, rare gram + cocci) -C/wVanc/Cefepime per ID -  Appreciate ID recs - C/w Oxycodone 15mg  q6 PRN for pain, Dilaudid 0.5mg  q3hr PRN for breakthrough - Ordered Dilaudid 1mg  prior to dressing change - Dressing changes per wound care/vascular surgery - C/w Lasix 40mg  PO  A.fib -C/w Warfarin per pharm dosing - Rate control w/ metoprolol 50mg  BID  RA/SLE - c/w home meds: Plaquenil 200mg   Bid, Prednisone 5mg  dailywith additional 5mg  PRN for arthritic pain  Hyperthyroidism - C/w home med: methimazole 7.5mg   DVT prophx:Coumadin Diet: Regular Bowel: Senokot Code: FULL  Dispo: Anticipated discharge in approximately 1 day(s).   , MD 03/05/2018, 10:00 AM Pager: [phone]

## 2018-03-05 NOTE — Progress Notes (Signed)
  Progress Note    03/05/2018 11:15 AM 2 Days Post-Op  Subjective: No acute issues, pain better controlled this morning  Vitals:   03/05/18 0502 03/05/18 0944  BP: (!) 116/47 (!) 116/47  Pulse: (!) 56 (!) 56  Resp: 20   Temp: 98.1 F (36.7 C)   SpO2: 99%     Physical Exam: Awake alert oriented Dressings to bilateral lower extremity are clean dry and intact  CBC    Component Value Date/Time   WBC 7.7 03/05/2018 0614   RBC 3.27 (L) 03/05/2018 0614   HGB 8.7 (L) 03/05/2018 0614   HCT 28.7 (L) 03/05/2018 0614   PLT 300 03/05/2018 0614   MCV 87.8 03/05/2018 0614   MCH 26.6 03/05/2018 0614   MCHC 30.3 03/05/2018 0614   RDW 14.6 03/05/2018 0614   LYMPHSABS 1.7 02/28/2018 1246   MONOABS 0.7 02/28/2018 1246   EOSABS 0.4 02/28/2018 1246   BASOSABS 0.0 02/28/2018 1246    BMET    Component Value Date/Time   NA 137 03/03/2018 0432   K 4.2 03/03/2018 0432   CL 99 03/03/2018 0432   CO2 34 (H) 03/03/2018 0432   GLUCOSE 87 03/03/2018 0432   BUN 11 03/03/2018 0432   CREATININE 0.97 03/03/2018 0432   CALCIUM 8.5 (L) 03/03/2018 0432   GFRNONAA 55 (L) 03/03/2018 0432   GFRAA >60 03/03/2018 0432    INR    Component Value Date/Time   INR 1.49 03/05/2018 0614     Intake/Output Summary (Last 24 hours) at 03/05/2018 1115 Last data filed at 03/05/2018 0700 Gross per 24 hour  Intake 3880.54 ml  Output 700 ml  Net 3180.54 ml     Assessment:  77 y.o. female is s/p debridement bilateral lower extremity ulcerations  Plan: Unna boots have been ordered and will plan dressing changes today at bedside.   Brandon C. Randie Heinz, MD [facility] Office: [phone] Pager: [phone]  03/05/2018 11:15 AM

## 2018-03-05 NOTE — Plan of Care (Signed)
  Problem: Education: Goal: Knowledge of General Education information will improve Description Including pain rating scale, medication(s)/side effects and non-pharmacologic comfort measures Outcome: Progressing   Problem: Clinical Measurements: Goal: Ability to maintain clinical measurements within normal limits will improve Outcome: Progressing Goal: Will remain free from infection Outcome: Progressing Goal: Diagnostic test results will improve Outcome: Progressing Goal: Respiratory complications will improve Outcome: Progressing Goal: Cardiovascular complication will be avoided Outcome: Progressing   Problem: Activity: Goal: Risk for activity intolerance will decrease Outcome: Progressing   Problem: Nutrition: Goal: Adequate nutrition will be maintained Outcome: Progressing   Problem: Coping: Goal: Level of anxiety will decrease Outcome: Progressing   Problem: Elimination: Goal: Will not experience complications related to bowel motility Outcome: Progressing   Problem: Pain Managment: Goal: General experience of comfort will improve Outcome: Progressing   Problem: Safety: Goal: Ability to remain free from injury will improve Outcome: Progressing

## 2018-03-06 ENCOUNTER — Other Ambulatory Visit: Payer: Self-pay | Admitting: Internal Medicine

## 2018-03-06 DIAGNOSIS — L089 Local infection of the skin and subcutaneous tissue, unspecified: Secondary | ICD-10-CM

## 2018-03-06 DIAGNOSIS — B9620 Unspecified Escherichia coli [E. coli] as the cause of diseases classified elsewhere: Secondary | ICD-10-CM

## 2018-03-06 LAB — AEROBIC CULTURE W GRAM STAIN (SUPERFICIAL SPECIMEN)

## 2018-03-06 LAB — BASIC METABOLIC PANEL
ANION GAP: 10 (ref 5–15)
BUN: 11 mg/dL (ref 8–23)
CHLORIDE: 94 mmol/L — AB (ref 98–111)
CO2: 32 mmol/L (ref 22–32)
Calcium: 9 mg/dL (ref 8.9–10.3)
Creatinine, Ser: 0.92 mg/dL (ref 0.44–1.00)
GFR calc non Af Amer: 59 mL/min — ABNORMAL LOW (ref >60)
Glucose, Bld: 95 mg/dL (ref 70–99)
Potassium: 3.5 mmol/L (ref 3.5–5.1)
Sodium: 136 mmol/L (ref 135–145)

## 2018-03-06 LAB — AEROBIC CULTURE  (SUPERFICIAL SPECIMEN)

## 2018-03-06 LAB — CBC
HEMATOCRIT: 31.6 % — AB (ref 36.0–46.0)
HEMOGLOBIN: 9.5 g/dL — AB (ref 12.0–15.0)
MCH: 26.5 pg (ref 26.0–34.0)
MCHC: 30.1 g/dL (ref 30.0–36.0)
MCV: 88 fL (ref 78.0–100.0)
Platelets: 334 10*3/uL (ref 150–400)
RBC: 3.59 MIL/uL — ABNORMAL LOW (ref 3.87–5.11)
RDW: 14.6 % (ref 11.5–15.5)
WBC: 7.8 10*3/uL (ref 4.0–10.5)

## 2018-03-06 LAB — PROTIME-INR
INR: 1.6
Prothrombin Time: 18.9 seconds — ABNORMAL HIGH (ref 11.4–15.2)

## 2018-03-06 MED ORDER — SODIUM CHLORIDE 0.9 % IV SOLN
INTRAVENOUS | Status: DC | PRN
  Administered 2018-03-06: 250 mL via INTRAVENOUS

## 2018-03-06 MED ORDER — WARFARIN SODIUM 6 MG PO TABS
6.0000 mg | ORAL_TABLET | Freq: Once | ORAL | Status: AC
  Administered 2018-03-06: 6 mg via ORAL
  Filled 2018-03-06: qty 1

## 2018-03-06 MED ORDER — FUROSEMIDE 40 MG PO TABS: 40.0000 mg | ORAL_TABLET | Freq: Every day | ORAL | 0 refills | Status: AC

## 2018-03-06 NOTE — Progress Notes (Signed)
ANTICOAGULATION CONSULT NOTE - Follow Up Consult  Pharmacy Consult for Coumadin Indication: atrial fibrillation  Allergies  Allergen Reactions  . Latex Rash  . Levofloxacin Palpitations    Patient Measurements: Height: 5\' 4"  (162.6 cm) Weight: 233 lb 9.6 oz (106 kg) IBW/kg (Calculated) : 54.7  Vital Signs: Temp: 97.6 F (36.4 C) (08/26 0400) Temp Source: Oral (08/26 0400) BP: 108/57 (08/26 0937) Pulse Rate: 75 (08/26 0937)  Labs: Recent Labs    03/04/18 0352 03/05/18 0614 03/06/18 0313  HGB 9.0* 8.7* 9.5*  HCT 30.2* 28.7* 31.6*  PLT 343 300 334  LABPROT 17.3* 17.9* 18.9*  INR 1.43 1.49 1.60  CREATININE  --   --  0.92    Estimated Creatinine Clearance: 61.8 mL/min (by C-G formula based on SCr of 0.92 mg/dL).  Assessment:  Anticoag: warfarin for afib. Held for operative debridement of venous ulcers. Pt received Vit K 1mg  IV on 8/22. Coum resumed 8/23. - PTA dose 5mg  daily, admit INR 2.94  INR remains subtherapeutic at 1.6 today.  Hgb 9.5 stable, plts stable, no reports of bleeding.  Goal of Therapy:  INR 2-3 Monitor platelets by anticoagulation protocol: Yes   Plan:  Coumadin 6mg  po x 1 again tonight Plan to continue home dose on DC with close f/u  Daily INR   9/22, PharmD PGY1 Pharmacy Resident Phone [phone] 03/06/2018 10:14 AM

## 2018-03-06 NOTE — Progress Notes (Signed)
Internal Medicine Attending  Date: 03/06/2018  Patient name: Shirley Waters Medical record number: [id number] Date of birth: 06-04-1941 Age: 77 y.o. Gender: female  I saw and evaluated the patient. I reviewed the resident's note by Dr. Nedra Hai and I agree with the resident's findings and plans as documented in his progress note.  Seen on rounds this morning [name] had numerous complaints including debilitating pain, especially with bandage changes, and regrets that she even went through this debridement. Her final concern was that we had taken too much fluid off of her legs. When we walked in the room she was sitting in a chair with her right leg elevated and her left leg hanging down. She appeared comfortable although sad and frustrated with her acute and chronic illnesses. When asked about her pain regimen she stated it was fine that it just wasn't coming quickly enough. She felt no changes were necessary in the doses or frequency.  I believe she would do well at this point with home health services to encourage her to move about before she becomes further debilitated. From an internal medicine standpoint she is ready for discharge and we are awaiting confirmation of that opinion from both the infectious disease standpoint, where we hope to convert to oral antibiotics, and from the surgical standpoint. Case management is working on home health services.

## 2018-03-06 NOTE — Discharge Summary (Signed)
Name: Alleah Matis MRN: [id number] DOB: 11-22-1940 77 y.o. PCP: Renaldo Harrison, DO  Date of Admission: 02/28/2018 12:16 PM Date of Discharge: 02/28/2018 05:00 PM Attending Physician: Doneen Poisson, MD  Discharge Diagnosis: 1. Bilateral lower extremity venous stasis ulcers  Discharge Medications: Allergies as of 03/06/2018      Reactions   Latex Rash   Levofloxacin Palpitations      Medication List    TAKE these medications   CELEBREX 200 MG capsule Generic drug:  celecoxib Take by mouth 2 (two) times daily.   furosemide 40 MG tablet Commonly known as:  LASIX Take 1 tablet (40 mg total) by mouth daily. Start taking on:  03/07/2018 What changed:    when to take this  reasons to take this   hydroxychloroquine 200 MG tablet Commonly known as:  PLAQUENIL Take 200 mg by mouth 2 (two) times daily.   methimazole 5 MG tablet Commonly known as:  TAPAZOLE Take 7.5 mg by mouth daily.   methotrexate 2.5 MG tablet Take 20 mg by mouth once a week. Taking 8 tablets (2.5mg ) weekly   metolazone 5 MG tablet Commonly known as:  ZAROXOLYN Take 5 mg by mouth daily as needed (for fluid. takes with furosemide).   metoprolol tartrate 25 MG tablet Commonly known as:  LOPRESSOR Take 25 mg by mouth 2 (two) times daily.   MULTIVITAMIN ADULT EXTRA C PO Take 1 tablet by mouth daily.   oxyCODONE 15 MG immediate release tablet Commonly known as:  ROXICODONE Take 15 mg by mouth every 6 (six) hours as needed for pain. What changed:  Another medication with the same name was removed. Continue taking this medication, and follow the directions you see here.   predniSONE 10 MG tablet Commonly known as:  DELTASONE 5-10 mg daily with breakfast.   predniSONE 5 MG tablet Commonly known as:  DELTASONE Take 5-10 tablets by mouth See admin instructions. Taking one 5mg  or one 10mg  tablet daily depending on arthritis symptoms   SYSTANE BALANCE 0.6 % Soln Generic drug:  Propylene Glycol Apply 1  drop to eye as needed (dry eyes).   VITAMIN D PO Take 5,000 Units by mouth daily.   warfarin 5 MG tablet Commonly known as:  COUMADIN Take 5 mg by mouth one time only at 6 PM.   XEROFORM PETROLAT GAUZE 1"X8" EX Apply 1 application topically 3 (three) times daily.      Disposition and follow-up:   Ms.Jovita Farnell was discharged from [facility] in Stable condition.  At the hospital follow up visit please address:  1. Bilateral venous stasis ulcers: She had I&D in OR and discharged with Unna boot and home health nursing for wound care. Please make sure she is getting the proper wound care support. Also her Lasix was changed from PRN to standing 40mg  daily. Please check BMP to assess creatinine and K.  2. A.fib: Her coumadin was stopped for her I&D. INR subtherapeutic (1.60) on discharge. Please make sure it has reached therapeutic levels  3.  Labs / imaging needed at time of follow-up: INR, BMP  4.  Pending labs/ test needing follow-up: None  Follow-up Appointments: Follow-up Information    [facility] Follow up.   Why:  Registered Nurse, Physical/Occupational Therapy, Aide Contact information: [address] [place] [address] [phone]        Cliffton Asters, MD Follow up on 03/16/2018.   Specialty:  Infectious Diseases Contact information: [address]  [place] [address] [phone]        Renaldo Harrison, DO. Call.   Specialty:  Family Medicine Contact information: [address] [place] [address] [phone]          Hospital Course by problem list: 1. Bilateral venous stasis ulcers: Presented with bilateral mid-shin ulcers with purulent drainage. She was started on IV vanc and cefepime on 02/28/18 and evaluated by vascular surgery. Vascular surgery took the patient to OR for I&D on 03/03/18 after her INR had come down to an acceptable level after stopping her Coumadin. She tolerated the procedure well and  was discharged on 03/06/18 after completeing 6 day course of IV vanc, cefepime with instructions to take daily lasix and referral for home health nurse for wound care. No antibiotics indicated at discharge.  Discharge Vitals:   BP (!) 119/44 (BP Location: Right Arm)   Pulse (!) 56   Temp 98.2 F (36.8 C) (Oral)   Resp 20   Ht 5\' 4"  (1.626 m)   Wt 106 kg   SpO2 95%   BMI 40.10 kg/m   Pertinent Labs, Studies, and Procedures:  Results for orders placed or performed during the hospital encounter of 02/28/18 (from the past 24 hour(s))  Protime-INR     Status: Abnormal   Collection Time: 03/06/18  3:13 AM  Result Value Ref Range   Prothrombin Time 18.9 (H) 11.4 - 15.2 seconds   INR 1.60   CBC     Status: Abnormal   Collection Time: 03/06/18  3:13 AM  Result Value Ref Range   WBC 7.8 4.0 - 10.5 K/uL   RBC 3.59 (L) 3.87 - 5.11 MIL/uL   Hemoglobin 9.5 (L) 12.0 - 15.0 g/dL   HCT 03/08/18 (L) 10.2 - 72.5 %   MCV 88.0 78.0 - 100.0 fL   MCH 26.5 26.0 - 34.0 pg   MCHC 30.1 30.0 - 36.0 g/dL   RDW 36.6 44.0 - 34.7 %   Platelets 334 150 - 400 K/uL  Basic metabolic panel     Status: Abnormal   Collection Time: 03/06/18  3:13 AM  Result Value Ref Range   Sodium 136 135 - 145 mmol/L   Potassium 3.5 3.5 - 5.1 mmol/L   Chloride 94 (L) 98 - 111 mmol/L   CO2 32 22 - 32 mmol/L   Glucose, Bld 95 70 - 99 mg/dL   BUN 11 8 - 23 mg/dL   Creatinine, Ser 03/08/18 0.44 - 1.00 mg/dL   Calcium 9.0 8.9 - 9.56 mg/dL   GFR calc non Af Amer 59 (L) >60 mL/min   GFR calc Af Amer >60 >60 mL/min   Anion gap 10 5 - 15   Results for orders placed or performed during the hospital encounter of 02/28/18  Culture, blood (routine x 2)     Status: None   Collection Time: 02/28/18  2:59 PM  Result Value Ref Range Status   Specimen Description BLOOD RIGHT WRIST  Final   Special Requests   Final    BOTTLES DRAWN AEROBIC AND ANAEROBIC Blood Culture results may not be optimal due to an inadequate volume of blood received in  culture bottles   Culture   Final    NO GROWTH 5 DAYS Performed at [facility], [address] [place], [place]    Report Status 03/05/2018 FINAL  Final  Culture, blood (routine x 2)     Status: None   Collection Time: 02/28/18  3:38 PM  Result Value Ref Range Status   Specimen Description BLOOD SITE NOT SPECIFIED  Final   Special Requests   Final    BOTTLES DRAWN AEROBIC AND ANAEROBIC Blood Culture results may not be optimal due to an excessive volume of blood received in culture bottles   Culture   Final    NO GROWTH 5 DAYS Performed at [facility], [address], [place], [place] [address]    Report Status 03/05/2018 FINAL  Final  Surgical pcr screen     Status: None   Collection Time: 03/03/18  4:53 AM  Result Value Ref Range Status   MRSA, PCR NEGATIVE NEGATIVE Final   Staphylococcus aureus NEGATIVE NEGATIVE Final    Comment: (NOTE) The Xpert SA Assay (FDA approved for NASAL specimens in patients 69 years of age and older), is one component of a comprehensive surveillance program. It is not intended to diagnose infection nor to guide or monitor treatment. Performed at [facility], [address], [place], [place] [address]   Anaerobic culture     Status: None (Preliminary result)   Collection Time: 03/03/18  1:08 PM  Result Value Ref Range Status   Specimen Description TISSUE BILATERAL LEG  Final   Special Requests VANCOMYCIN  Final   Gram Stain PENDING  Incomplete   Culture   Final    HOLDING FOR POSSIBLE ANAEROBE Performed at [facility], [address] [place], [place] [address]    Report Status PENDING  Incomplete  Aerobic Culture (superficial specimen)     Status: None   Collection Time: 03/03/18  1:08 PM  Result Value Ref Range Status   Specimen Description TISSUE BILATERAL LEG  Final   Special Requests VANCOMYCIN  Final   Gram Stain   Final    MODERATE WBC PRESENT, PREDOMINANTLY PMN FEW GRAM POSITIVE COCCI CRITICAL  RESULT CALLED TO, READ BACK BY AND VERIFIED WITH: B. SLOOP, RN CONCERNING CULTURE RESULT AT 1620 ON 03/05/18 BY C. JESSUP, MLT Performed at [facility], [address], [place], [place] [address]    Culture   Final    FEW PSEUDOMONAS AERUGINOSA RARE METHICILLIN RESISTANT STAPHYLOCOCCUS AUREUS FEW ESCHERICHIA COLI Confirmed Extended Spectrum Beta-Lactamase Producer (ESBL).  In bloodstream infections from ESBL organisms, carbapenems are preferred over piperacillin/tazobactam. They are shown to have a lower risk of mortality.    Report Status 03/06/2018 FINAL  Final   Organism ID, Bacteria PSEUDOMONAS AERUGINOSA  Final   Organism ID, Bacteria METHICILLIN RESISTANT STAPHYLOCOCCUS AUREUS  Final   Organism ID, Bacteria ESCHERICHIA COLI  Final      Susceptibility   Escherichia coli - MIC*    AMPICILLIN >=32 RESISTANT Resistant     CEFAZOLIN >=64 RESISTANT Resistant     CEFEPIME >=64 RESISTANT Resistant     CEFTAZIDIME RESISTANT Resistant     CEFTRIAXONE >=64 RESISTANT Resistant     CIPROFLOXACIN >=4 RESISTANT Resistant     GENTAMICIN <=1 SENSITIVE Sensitive     IMIPENEM <=0.25 SENSITIVE Sensitive     TRIMETH/SULFA <=20 SENSITIVE Sensitive     AMPICILLIN/SULBACTAM 16 INTERMEDIATE Intermediate     PIP/TAZO <=4 SENSITIVE Sensitive     Extended ESBL POSITIVE Resistant     * FEW ESCHERICHIA COLI   Methicillin resistant staphylococcus aureus - MIC*    CIPROFLOXACIN >=8 RESISTANT Resistant     ERYTHROMYCIN >=8 RESISTANT Resistant     GENTAMICIN <=0.5 SENSITIVE Sensitive     OXACILLIN >=4 RESISTANT Resistant  TETRACYCLINE <=1 SENSITIVE Sensitive     VANCOMYCIN 1 SENSITIVE Sensitive     TRIMETH/SULFA <=10 SENSITIVE Sensitive     CLINDAMYCIN >=8 RESISTANT Resistant     RIFAMPIN <=0.5 SENSITIVE Sensitive     Inducible Clindamycin NEGATIVE Sensitive     * RARE METHICILLIN RESISTANT STAPHYLOCOCCUS AUREUS   Pseudomonas aeruginosa - MIC*    CEFTAZIDIME >=64 RESISTANT Resistant      CIPROFLOXACIN <=0.25 SENSITIVE Sensitive     GENTAMICIN <=1 SENSITIVE Sensitive     IMIPENEM 2 SENSITIVE Sensitive     CEFEPIME 32 RESISTANT Resistant     * FEW PSEUDOMONAS AERUGINOSA  Aerobic Culture (superficial specimen)     Status: None   Collection Time: 03/03/18  1:13 PM  Result Value Ref Range Status   Specimen Description TISSUE BILATERAL LEG  Final   Special Requests VAMCOMYCIN  Final   Gram Stain   Final    FEW WBC PRESENT, PREDOMINANTLY PMN RARE GRAM POSITIVE COCCI CRITICAL RESULT CALLED TO, READ BACK BY AND VERIFIED WITH: B. SLOOP, RN CONCERNING CULTURE RESULT AT 1620 ON 03/05/18 BY C. JESSUP, MLT. Performed at [facility], [address], [place], [place] [address]    Culture   Final    RARE PSEUDOMONAS AERUGINOSA FEW ESCHERICHIA COLI Confirmed Extended Spectrum Beta-Lactamase Producer (ESBL).  In bloodstream infections from ESBL organisms, carbapenems are preferred over piperacillin/tazobactam. They are shown to have a lower risk of mortality. FEW CORYNEBACTERIUM SPECIES    Report Status 03/06/2018 FINAL  Final   Organism ID, Bacteria PSEUDOMONAS AERUGINOSA  Final   Organism ID, Bacteria ESCHERICHIA COLI  Final      Susceptibility   Escherichia coli - MIC*    AMPICILLIN >=32 RESISTANT Resistant     CEFAZOLIN >=64 RESISTANT Resistant     CEFEPIME >=64 RESISTANT Resistant     CEFTAZIDIME RESISTANT Resistant     CEFTRIAXONE >=64 RESISTANT Resistant     CIPROFLOXACIN >=4 RESISTANT Resistant     GENTAMICIN >=16 RESISTANT Resistant     IMIPENEM <=0.25 SENSITIVE Sensitive     TRIMETH/SULFA <=20 SENSITIVE Sensitive     AMPICILLIN/SULBACTAM >=32 RESISTANT Resistant     PIP/TAZO 8 SENSITIVE Sensitive     Extended ESBL POSITIVE Resistant     * FEW ESCHERICHIA COLI   Pseudomonas aeruginosa - MIC*    CEFTAZIDIME 4 SENSITIVE Sensitive     CIPROFLOXACIN <=0.25 SENSITIVE Sensitive     GENTAMICIN <=1 SENSITIVE Sensitive     IMIPENEM 2 SENSITIVE Sensitive     PIP/TAZO  8 SENSITIVE Sensitive     CEFEPIME 2 SENSITIVE Sensitive     * RARE PSEUDOMONAS AERUGINOSA   Discharge Instructions: Mrs. Arita Miss,  You came to [place] with complaints of leg wounds. We had out infectious disease specialist assess your wounds and give [place] recommendations on your antibiotic regimen. We also had our vascular surgeons assess your legs and they took you to the OR for cleaning the wounds. We have put on Unna boots for you and we believe you are ready for discharge. Our case manager will arrange a home health nurse to come and manage your wound care. Here are our recommendations for you at discharge:  START elevating your feet as often as possible START taking furosemid (Lasix) 40mg  every day instead of as needed  Try to keep your lower legs as clean as possible and diligently go to your wound care center after home health nursing is complete.  Discharge Instructions    Call  MD for:  difficulty breathing, headache or visual disturbances   Complete by:  As directed    Call MD for:  persistant dizziness or light-headedness   Complete by:  As directed    Call MD for:  persistant nausea and vomiting   Complete by:  As directed    Call MD for:  redness, tenderness, or signs of infection (pain, swelling, redness, odor or green/yellow discharge around incision site)   Complete by:  As directed    Call MD for:  severe uncontrolled pain   Complete by:  As directed    Call MD for:  temperature >100.4   Complete by:  As directed    Diet - low sodium heart healthy   Complete by:  As directed    Increase activity slowly   Complete by:  As directed      Signed: Theotis Barrio, MD 03/06/2018, 4:32 PM   Pager: [phone]

## 2018-03-06 NOTE — Progress Notes (Signed)
Patient unable to get in touch with anyone to pick her up. Her friend has the key to her house so she would be unable to get into her house even with another ride. I advised her to keep calling and let me know when she is able to reach someone. Ripley Fraise D

## 2018-03-06 NOTE — Care Management Note (Addendum)
Case Management Note  Patient Details  Name: Shirley Waters MRN: [id number] Date of Birth: 1940-11-26  Subjective/Objective: Pt presented for worsening leg ulcers right > left. PTA from home alone and fairly independent. Pt has DME RW and [employer]- per patient she still drives. Patient's daughter lives in [place] and is willing to assist if needed. Patient states she wants [facility] Services and then to transition to Outpatient PT thereafter. Patient has utilized [facility] in [place] [phone] Fax [phone]. Referral for [facility] called to [facility]. SOC to begin within 24-48 hours post transition home.             Action/Plan: Will need new orders and F2F: HH RN for dressing changes and in comments state how often needed and if [employer] are Zinc vs Calamine, PT/OT and HH Aide. CM will make sure that [facility] will receive information to transition the patient safely home. Per MD pt will be switched to po antibiotics. Patient has transportation home- she will call a friend. No further needs from CM at this time.    Expected Discharge Date:                  Expected Discharge Plan:  Home w Home Health Services  In-House Referral:  NA  Discharge planning Services  CM Consult  Post Acute Care Choice:  Home Health Choice offered to:  Patient  DME Arranged:  N/A DME Agency:  NA  HH Arranged:  RN, Disease Management, PT, OT, Nurse's Aide HH Agency:  [facility]  Status of Service:  In process, will continue to follow  If discussed at Long Length of Stay Meetings, dates discussed:    Additional Comments: 1543 03-06-18 Tomi Bamberger, RN,BSN [phone] CM did fax information to [facility] and made Lafonda Mosses Intake RN aware. No further needs from CM at this time.  Gala Lewandowsky, RN 03/06/2018, 10:56 AM

## 2018-03-06 NOTE — Progress Notes (Signed)
Subjective: Shirley Waters is a 77 yo F w/ PMH of SLE, RA, A.Fib on Coumadin, Chronic venous insufficeincy and DJD s/p bilateral knee replacements presenting to the hospital for bilateral chronic ulcers on admit day6  Shirley Waters was examined and evaluated at her room sitting in her chair this AM. She states she was unhappy that she is not receiving her pain medication as fast as she would like but she does not want any increase in her pain regimen. She states her rheumatoid arthritis causes her significant pain at baseline but she just has low pain tolerance. She states her dressing change last night caused her debilitating pain but it has improved since then. Denies any F/N/V/D/C  Objective:  Vital signs in last 24 hours: Vitals:   03/05/18 1236 03/05/18 1942 03/05/18 2200 03/06/18 0400  BP: (!) 116/58 (!) 102/40  (!) 117/51  Pulse: (!) 57 (!) 58  68  Resp: 14 14  12   Temp: 99.1 F (37.3 C) 98 F (36.7 C)  97.6 F (36.4 C)  TempSrc: Oral Oral  Oral  SpO2: 99% (!) 88% 100% 98%  Weight:      Height:       Physical Exam  Constitutional: She is oriented to person, place, and time and well-developed, well-nourished, and in no distress. No distress.  HENT:  Head: Normocephalic and atraumatic.  Mouth/Throat: Oropharynx is clear and moist. No oropharyngeal exudate.  Eyes: Pupils are equal, round, and reactive to light. Conjunctivae and EOM are normal. No scleral icterus.  Neck: Normal range of motion. Neck supple. No JVD present. No thyromegaly present.  Cardiovascular: Normal rate, regular rhythm, normal heart sounds and intact distal pulses.  Pulmonary/Chest: Effort normal and breath sounds normal. No respiratory distress. She has no wheezes. She has no rales.  Abdominal: Soft. Bowel sounds are normal. She exhibits no distension. There is no tenderness. There is no guarding.  Musculoskeletal: Normal range of motion. She exhibits edema (lower extremity edema), tenderness and deformity  (rheumatic changes to fingers and toes).  Neurological: She is alert and oriented to person, place, and time. No cranial nerve deficit. Coordination normal. GCS score is 15.  Skin: Skin is warm and dry. No rash noted. She is not diaphoretic. No erythema.  Bilateral ulcers covered with bandaging. Did not remove to inspect  Psychiatric: Memory and judgment normal.   Assessment/Plan:  Principal Problem:   Wound infection Active Problems:   Venous stasis ulcers (HCC)   Normocytic anemia   DJD (degenerative joint disease)   S/P total knee arthroplasty, bilateral   History of DVT of lower extremity   Rheumatoid arthritis involving multiple joints (HCC)   Permanent atrial fibrillation (HCC)   Morbid obesity (HCC)  Shirley Waters is a 77 yo F w/ PMH of SLE, RA, Chronic venous insufficeincy and DJD s/p bilateral knee replacements presenting to the hospital for bilateral chronic ulcers.She was evaluated by vascular surgery who performedOR debridement on 03/03/18 without complications.Coumadin stoppedfor OR debridement, but restarted on 03/03/18. Current INR sub-therapeutic at 1.60  Lower extremity ulcers 2/2 venous stasis Known Venous stasis since 2007. Treated with compression stockings. Started developing ulcers recently. Left sided ulcer in 09/2017 requiring IV abx and admission. Right side ulcer developed during SNF post discharge and has been enlarging progressively despite wound care at her SNF. Her ulcers may have delayed healing due to her prednisone for her RA.Arterial dopplerresults from 01/2018 show no arterial occlusion and no significant stenosis.Blood culture show no growth after 5 days. WBC 7.8 -  F/uWoundCultures(pending: gram - rods, rare pseudomonas) -C/wVanc/Cefepime per ID - Appreciate ID recs for outpatient antibiotics -C/wOxycodone 15mg  q6 PRN for pain, Dilaudid 0.5mg  q3hr PRN for breakthrough - Appreciate Vascular surgery Recs - C/w Lasix 40mg  PO - Possible discharge  today   A.fib -C/w Warfarin per pharm dosing - Rate control w/ metoprolol 50mg  BID  RA/SLE - c/w home meds: Plaquenil 200mg  Bid, Prednisone 5mg  dailywith additional 5mg  PRN for arthritic pain  Hyperthyroidism - C/w home med: methimazole 7.5mg   DVT prophx:Coumadin Diet: Regular Bowel: Senokot Code: FULL  Dispo: Anticipated discharge in approximately today  , MD 03/06/2018, 9:19 AM Pager: [phone]

## 2018-03-06 NOTE — Plan of Care (Signed)
Antibiotics given 

## 2018-03-06 NOTE — Plan of Care (Signed)
  Problem: Education: Goal: Knowledge of General Education information will improve Description Including pain rating scale, medication(s)/side effects and non-pharmacologic comfort measures Outcome: Progressing   Problem: Clinical Measurements: Goal: Respiratory complications will improve Outcome: Progressing   Problem: Activity: Goal: Risk for activity intolerance will decrease Outcome: Progressing   Problem: Nutrition: Goal: Adequate nutrition will be maintained Outcome: Progressing   Problem: Coping: Goal: Level of anxiety will decrease Outcome: Progressing   Problem: Pain Managment: Goal: General experience of comfort will improve Outcome: Progressing   Problem: Safety: Goal: Ability to remain free from injury will improve Outcome: Progressing   

## 2018-03-06 NOTE — Progress Notes (Signed)
Patient ID: Shirley Waters, female   DOB: Oct 09, 1940, 77 y.o.   MRN: [id number]         [facility] for Infectious Disease  Date of Admission:  02/28/2018   Total days of antibiotics 7        Day 6 vancomycin        Day 6 cefepime         ASSESSMENT: Not surprisingly, her operative cultures of her lower extremity wounds are positive for Pseudomonas, MRSA and E. coli.  Fortunately with thorough debridement last week she will not need to be discharged on antibiotics.  At this point the focus should shift to local wound care and compression.  I will arrange to see her back in my clinic early next month.  PLAN: 1. Discontinue vancomycin and cefepime 2. I will arrange follow-up in my clinic on 03/16/2018 3. I will sign off now  Principal Problem:   Wound infection Active Problems:   Venous stasis ulcers (HCC)   Normocytic anemia   DJD (degenerative joint disease)   S/P total knee arthroplasty, bilateral   History of DVT of lower extremity   Rheumatoid arthritis involving multiple joints (HCC)   Permanent atrial fibrillation (HCC)   Morbid obesity (HCC)   Scheduled Meds: . furosemide  40 mg Oral Daily  . hydroxychloroquine  200 mg Oral BID  . methimazole  7.5 mg Oral Daily  . metoprolol tartrate  50 mg Oral BID  . predniSONE  5 mg Oral Q breakfast  . senna  1 tablet Oral BID  . warfarin  6 mg Oral ONCE-1800  . Warfarin - Pharmacist Dosing Inpatient   Does not apply q1800   Continuous Infusions: . sodium chloride 250 mL (03/06/18 1313)  . ceFEPime (MAXIPIME) IV 2 g (03/06/18 1315)  . vancomycin 750 mg (03/06/18 0116)   PRN Meds:.sodium chloride, acetaminophen **OR** acetaminophen, HYDROmorphone (DILAUDID) injection, HYDROmorphone (DILAUDID) injection, ondansetron **OR** ondansetron (ZOFRAN) IV, oxyCODONE, polyethylene glycol, predniSONE   SUBJECTIVE: She has had a lot of pain with dressing changes but otherwise is feeling much better.  Review of Systems: Review of Systems   Constitutional: Negative for chills, fever and malaise/fatigue.  Musculoskeletal: Positive for joint pain.    Allergies  Allergen Reactions  . Latex Rash  . Levofloxacin Palpitations    OBJECTIVE: Vitals:   03/05/18 1942 03/05/18 2200 03/06/18 0400 03/06/18 0937  BP: (!) 102/40  (!) 117/51 (!) 108/57  Pulse: (!) 58  68 75  Resp: 14  12   Temp: 98 F (36.7 C)  97.6 F (36.4 C)   TempSrc: Oral  Oral   SpO2: (!) 88% 100% 98%   Weight:      Height:       Body mass index is 40.1 kg/m.  Physical Exam  Constitutional: She is oriented to person, place, and time.  She is sitting up in a chair with her legs elevated.  Neurological: She is alert and oriented to person, place, and time.  Skin:  She shows me pictures on her cell phone from her dressing change.  Bilateral lower extremity ulcers now appear to have 100% clean granulation tissue.  Psychiatric: She has a normal mood and affect.    Lab Results Lab Results  Component Value Date   WBC 7.8 03/06/2018   HGB 9.5 (L) 03/06/2018   HCT 31.6 (L) 03/06/2018   MCV 88.0 03/06/2018   PLT 334 03/06/2018    Lab Results  Component Value Date   CREATININE  0.92 03/06/2018   BUN 11 03/06/2018   NA 136 03/06/2018   K 3.5 03/06/2018   CL 94 (L) 03/06/2018   CO2 32 03/06/2018    Lab Results  Component Value Date   ALT 9 02/28/2018   AST 20 02/28/2018   ALKPHOS 65 02/28/2018   BILITOT 0.7 02/28/2018     Microbiology: Recent Results (from the past 240 hour(s))  Culture, blood (routine x 2)     Status: None   Collection Time: 02/28/18  2:59 PM  Result Value Ref Range Status   Specimen Description BLOOD RIGHT WRIST  Final   Special Requests   Final    BOTTLES DRAWN AEROBIC AND ANAEROBIC Blood Culture results may not be optimal due to an inadequate volume of blood received in culture bottles   Culture   Final    NO GROWTH 5 DAYS Performed at [facility], [address], [place], [place] [address]    Report Status  03/05/2018 FINAL  Final  Culture, blood (routine x 2)     Status: None   Collection Time: 02/28/18  3:38 PM  Result Value Ref Range Status   Specimen Description BLOOD SITE NOT SPECIFIED  Final   Special Requests   Final    BOTTLES DRAWN AEROBIC AND ANAEROBIC Blood Culture results may not be optimal due to an excessive volume of blood received in culture bottles   Culture   Final    NO GROWTH 5 DAYS Performed at [facility], [address] [place], [place] [address]    Report Status 03/05/2018 FINAL  Final  Surgical pcr screen     Status: None   Collection Time: 03/03/18  4:53 AM  Result Value Ref Range Status   MRSA, PCR NEGATIVE NEGATIVE Final   Staphylococcus aureus NEGATIVE NEGATIVE Final    Comment: (NOTE) The Xpert SA Assay (FDA approved for NASAL specimens in patients 39 years of age and older), is one component of a comprehensive surveillance program. It is not intended to diagnose infection nor to guide or monitor treatment. Performed at [facility], [address], [place], [place] [address]   Anaerobic culture     Status: None (Preliminary result)   Collection Time: 03/03/18  1:08 PM  Result Value Ref Range Status   Specimen Description TISSUE BILATERAL LEG  Final   Special Requests VANCOMYCIN  Final   Gram Stain PENDING  Incomplete   Culture   Final    HOLDING FOR POSSIBLE ANAEROBE Performed at [facility], [address] [place], [place] [address]    Report Status PENDING  Incomplete  Aerobic Culture (superficial specimen)     Status: None   Collection Time: 03/03/18  1:08 PM  Result Value Ref Range Status   Specimen Description TISSUE BILATERAL LEG  Final   Special Requests VANCOMYCIN  Final   Gram Stain   Final    MODERATE WBC PRESENT, PREDOMINANTLY PMN FEW GRAM POSITIVE COCCI CRITICAL RESULT CALLED TO, READ BACK BY AND VERIFIED WITH: B. SLOOP, RN CONCERNING CULTURE RESULT AT 1620 ON 03/05/18 BY C. JESSUP, MLT Performed at [facility], [address] [place], [place] [address]    Culture   Final    FEW PSEUDOMONAS AERUGINOSA RARE METHICILLIN RESISTANT STAPHYLOCOCCUS AUREUS FEW ESCHERICHIA COLI Confirmed Extended Spectrum Beta-Lactamase Producer (ESBL).  In bloodstream infections from ESBL organisms, carbapenems are preferred over piperacillin/tazobactam. They are shown to have a lower risk of  mortality.    Report Status 03/06/2018 FINAL  Final   Organism ID, Bacteria PSEUDOMONAS AERUGINOSA  Final   Organism ID, Bacteria METHICILLIN RESISTANT STAPHYLOCOCCUS AUREUS  Final   Organism ID, Bacteria ESCHERICHIA COLI  Final      Susceptibility   Escherichia coli - MIC*    AMPICILLIN >=32 RESISTANT Resistant     CEFAZOLIN >=64 RESISTANT Resistant     CEFEPIME >=64 RESISTANT Resistant     CEFTAZIDIME RESISTANT Resistant     CEFTRIAXONE >=64 RESISTANT Resistant     CIPROFLOXACIN >=4 RESISTANT Resistant     GENTAMICIN <=1 SENSITIVE Sensitive     IMIPENEM <=0.25 SENSITIVE Sensitive     TRIMETH/SULFA <=20 SENSITIVE Sensitive     AMPICILLIN/SULBACTAM 16 INTERMEDIATE Intermediate     PIP/TAZO <=4 SENSITIVE Sensitive     Extended ESBL POSITIVE Resistant     * FEW ESCHERICHIA COLI   Methicillin resistant staphylococcus aureus - MIC*    CIPROFLOXACIN >=8 RESISTANT Resistant     ERYTHROMYCIN >=8 RESISTANT Resistant     GENTAMICIN <=0.5 SENSITIVE Sensitive     OXACILLIN >=4 RESISTANT Resistant     TETRACYCLINE <=1 SENSITIVE Sensitive     VANCOMYCIN 1 SENSITIVE Sensitive     TRIMETH/SULFA <=10 SENSITIVE Sensitive     CLINDAMYCIN >=8 RESISTANT Resistant     RIFAMPIN <=0.5 SENSITIVE Sensitive     Inducible Clindamycin NEGATIVE Sensitive     * RARE METHICILLIN RESISTANT STAPHYLOCOCCUS AUREUS   Pseudomonas aeruginosa - MIC*    CEFTAZIDIME >=64 RESISTANT Resistant     CIPROFLOXACIN <=0.25 SENSITIVE Sensitive     GENTAMICIN <=1 SENSITIVE Sensitive     IMIPENEM 2 SENSITIVE Sensitive     CEFEPIME 32 RESISTANT Resistant      * FEW PSEUDOMONAS AERUGINOSA  Aerobic Culture (superficial specimen)     Status: None   Collection Time: 03/03/18  1:13 PM  Result Value Ref Range Status   Specimen Description TISSUE BILATERAL LEG  Final   Special Requests VAMCOMYCIN  Final   Gram Stain   Final    FEW WBC PRESENT, PREDOMINANTLY PMN RARE GRAM POSITIVE COCCI CRITICAL RESULT CALLED TO, READ BACK BY AND VERIFIED WITH: B. SLOOP, RN CONCERNING CULTURE RESULT AT 1620 ON 03/05/18 BY C. JESSUP, MLT. Performed at [facility], [address], [place], [place] [address]    Culture   Final    RARE PSEUDOMONAS AERUGINOSA FEW ESCHERICHIA COLI Confirmed Extended Spectrum Beta-Lactamase Producer (ESBL).  In bloodstream infections from ESBL organisms, carbapenems are preferred over piperacillin/tazobactam. They are shown to have a lower risk of mortality. FEW CORYNEBACTERIUM SPECIES    Report Status 03/06/2018 FINAL  Final   Organism ID, Bacteria PSEUDOMONAS AERUGINOSA  Final   Organism ID, Bacteria ESCHERICHIA COLI  Final      Susceptibility   Escherichia coli - MIC*    AMPICILLIN >=32 RESISTANT Resistant     CEFAZOLIN >=64 RESISTANT Resistant     CEFEPIME >=64 RESISTANT Resistant     CEFTAZIDIME RESISTANT Resistant     CEFTRIAXONE >=64 RESISTANT Resistant     CIPROFLOXACIN >=4 RESISTANT Resistant     GENTAMICIN >=16 RESISTANT Resistant     IMIPENEM <=0.25 SENSITIVE Sensitive     TRIMETH/SULFA <=20 SENSITIVE Sensitive     AMPICILLIN/SULBACTAM >=32 RESISTANT Resistant     PIP/TAZO 8 SENSITIVE Sensitive     Extended ESBL POSITIVE Resistant     * FEW ESCHERICHIA COLI   Pseudomonas aeruginosa - MIC*    CEFTAZIDIME 4  SENSITIVE Sensitive     CIPROFLOXACIN <=0.25 SENSITIVE Sensitive     GENTAMICIN <=1 SENSITIVE Sensitive     IMIPENEM 2 SENSITIVE Sensitive     PIP/TAZO 8 SENSITIVE Sensitive     CEFEPIME 2 SENSITIVE Sensitive     * RARE PSEUDOMONAS AERUGINOSA    Cliffton Asters, MD [facility] for Infectious  Disease [facility] [phone] pager   [phone] cell 03/06/2018, 1:19 PM

## 2018-03-06 NOTE — Progress Notes (Signed)
Orthopedic Tech Progress Note Patient Details:  Shirley Waters 01/24/1941 [id number]  Ortho Devices Type of Ortho Device: Radio broadcast assistant Ortho Device/Splint Location: bilateral Ortho Device/Splint Interventions: Application   Post Interventions Patient Tolerated: Well Instructions Provided: Care of device   Dejaun Vidrio 03/06/2018, 11:02 AM

## 2018-03-07 LAB — PROTIME-INR
INR: 1.76
PROTHROMBIN TIME: 20.4 s — AB (ref 11.4–15.2)

## 2018-03-07 MED ORDER — WARFARIN SODIUM 5 MG PO TABS: 5.0000 mg | ORAL_TABLET | Freq: Once | ORAL | Status: DC

## 2018-03-07 NOTE — Progress Notes (Signed)
Internal Medicine Attending  Date: 03/07/2018  Patient name: Shirley Waters Medical record number: [id number] Date of birth: [birth year] Age: 77 y.o. Gender: female  I saw and evaluated the patient. I reviewed the resident's note by Dr. Nedra Hai and I agree with the resident's findings and plans as documented in his progress note.  When seen on rounds this morning Shirley Waters had numerous complaints.  Her concerns were addressed. She is stable for discharge home today with follow-up in a wound care clinic and with her primary care physician.

## 2018-03-07 NOTE — Progress Notes (Signed)
Written and verbal discharge instructions given.  Patient will have follow up with a home health agency and with her Doctor.  Patient was discharged via wheel chair with a friend.

## 2018-03-07 NOTE — Progress Notes (Signed)
ANTICOAGULATION CONSULT NOTE - Follow Up Consult  Pharmacy Consult for Coumadin Indication: atrial fibrillation  Allergies  Allergen Reactions  . Latex Rash  . Levofloxacin Palpitations    Patient Measurements: Height: 5\' 4"  (162.6 cm) Weight: 233 lb 9.6 oz (106 kg) IBW/kg (Calculated) : 54.7  Vital Signs: Temp: 98.5 F (36.9 C) (08/27 0606) Temp Source: Oral (08/27 0606) BP: 109/49 (08/27 0606) Pulse Rate: 70 (08/27 0606)  Labs: Recent Labs    03/05/18 0614 03/06/18 0313 03/07/18 0540  HGB 8.7* 9.5*  --   HCT 28.7* 31.6*  --   PLT 300 334  --   LABPROT 17.9* 18.9* 20.4*  INR 1.49 1.60 1.76  CREATININE  --  0.92  --     Estimated Creatinine Clearance: 61.8 mL/min (by C-G formula based on SCr of 0.92 mg/dL).  Assessment:  Anticoag: warfarin for afib. Held for operative debridement of venous ulcers. Pt received Vit K 1mg  IV on 8/22. Coum resumed 8/23. - PTA dose 5mg  daily, admit INR 2.94  INR remains subtherapeutic at 1.76 today.  Hgb 9.5 stable, plts stable, no reports of bleeding.  Goal of Therapy:  INR 2-3 Monitor platelets by anticoagulation protocol: Yes   Plan:  Coumadin 5mg  PO tonight - anticipate accumulation from larger doses previously administered Continue home dose of 5mg  daily with monitoring with home health and close follow up Daily INR   9/22, PharmD PGY1 Pharmacy Resident Phone [phone] 03/07/2018 12:18 PM

## 2018-03-07 NOTE — Progress Notes (Signed)
   Subjective:  Shirley Waters is a 77 yo F w/ PMH of SLe, RA, A.fib on Warfarin, chronic venous insuffiencey in hospital for chronic bilateral venous stasis ulcers on admit day 7  Patient is doing well today but worried about the contact precautions and why they just now started. She is worried about progression of her wounds. She does not understand why E. Coli is in her wound. We discussed the importance of following wound care. She understands and is ready for discharge. She will follow-up with wound care, no antibiotics on discharge, and stable for discharge today.   Objective: Vital signs in last 24 hours: Vitals:   03/06/18 0937 03/06/18 1503 03/06/18 2101 03/07/18 0606  BP: (!) 108/57 (!) 119/44 124/78 (!) 109/49  Pulse: 75 (!) 56 (!) 59 70  Resp:  20 16 16   Temp:  98.2 F (36.8 C) 99.1 F (37.3 C) 98.5 F (36.9 C)  TempSrc:  Oral Oral Oral  SpO2:  95% 100% 99%  Weight:      Height:       Physical Exam  Constitutional: She is oriented to person, place, and time and well-developed, well-nourished, and in no distress. No distress.  HENT:  Head: Normocephalic and atraumatic.  Mouth/Throat: No oropharyngeal exudate.  Eyes: Pupils are equal, round, and reactive to light. Conjunctivae and EOM are normal. No scleral icterus.  Cardiovascular: Normal rate, regular rhythm, normal heart sounds and intact distal pulses.  Pulmonary/Chest: Effort normal and breath sounds normal. No respiratory distress. She has no wheezes. She has no rales.  Abdominal: Soft. Bowel sounds are normal. She exhibits no distension. There is no tenderness. There is no guarding.  Musculoskeletal: Normal range of motion. She exhibits edema, tenderness and deformity.  Lower extremity covered with bandaging did not remove to inspect  Neurological: She is alert and oriented to person, place, and time. No cranial nerve deficit. GCS score is 15.  Skin: Skin is warm and dry. She is not diaphoretic.     Assessment/Plan:  Principal Problem:   Wound infection Active Problems:   Venous stasis ulcers (HCC)   Normocytic anemia   DJD (degenerative joint disease)   S/P total knee arthroplasty, bilateral   History of DVT of lower extremity   Rheumatoid arthritis involving multiple joints (HCC)   Permanent atrial fibrillation (HCC)   Morbid obesity (HCC)  Shirley Waters is a 77 yo F w/ PMH of RA, SLE, A.fib, Venous insufficiency admit for bilateral venous stasis ulcers s/p I&D and vanc, cefepime (received for 7 days)  Lower extremity venous stasis ulcers Stable for discharge. Follow up with home health wound nurse and outpatient ID. Advise leg elevation and keeping wounds clean.  - Appreciate ID recs (recommend no more abx therapy) - c/w oxycodone 15mg  q6 PRN for pain, Dialaudid 0.5mg  q3hr PRN for breakthrough - C/w Lasix 40mg  PO  A.fib Rate controlled on beta blocker. Coumadin interrupted for OR. Restarted after procedure. Current INR 1.76 - c/w coumadin dosing per pharm - c/w metoprolol 50mg  BID  RA/Sle - c/w home meds (plaquenil, prednisone 5mg  + 5mg )  Hyperthyroidism - c/w home meds (methimazole)  DVT prophx: Coumadin Diet: Regular Bowel: Senokot Code: Full  Dispo: Anticipated discharge in approximately today(s).   74, MD 03/07/2018, 9:06 AM   PGY1 Pager: [phone]

## 2018-03-07 NOTE — Progress Notes (Signed)
Faxed clinicals, orders, and demographics to [facility] at [phone]

## 2018-03-08 LAB — ANAEROBIC CULTURE

## 2018-03-16 ENCOUNTER — Ambulatory Visit (INDEPENDENT_AMBULATORY_CARE_PROVIDER_SITE_OTHER): Payer: Medicare Other | Admitting: Internal Medicine

## 2018-03-16 ENCOUNTER — Encounter: Payer: Self-pay | Admitting: Internal Medicine

## 2018-03-16 DIAGNOSIS — L97209 Non-pressure chronic ulcer of unspecified calf with unspecified severity: Secondary | ICD-10-CM

## 2018-03-16 DIAGNOSIS — I83002 Varicose veins of unspecified lower extremity with ulcer of calf: Secondary | ICD-10-CM

## 2018-03-16 NOTE — Assessment & Plan Note (Signed)
She has chronic venous stasis ulcers without evidence of any active infection.  I had several long discussions with her recently about how difficult these can be to heal.  There is no indication for antibiotics at this time.  I feel very strongly that she needs to come back under the care of a wound care professional.  She very much wants to be seen outside of [place].  I would suggest that she try to establish care in .  I think the focus should continue to be on local wound care and compression.  I will be available to see her as needed.

## 2018-03-16 NOTE — Progress Notes (Signed)
[facility]  Patient Active Problem List   Diagnosis Date Noted  . Venous stasis ulcers (HCC) 10/13/2017    Priority: High  . Normocytic anemia 03/01/2018  . DJD (degenerative joint disease) 03/01/2018  . 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 History of DVT of lower extremity 03/01/2018  . Rheumatoid arthritis involving multiple joints (HCC)   . Permanent atrial fibrillation (HCC)   . Morbid obesity (HCC)   . Rheumatoid arthritis (HCC) 10/13/2017  . Obesity 10/13/2017  . Degenerative disc disease, cervical 10/13/2017  . Lupus (HCC) 10/13/2017    Patient's Medications  New Prescriptions   No medications on file  Previous Medications   BISMUTH TRIBROMOPH-PETROLATUM (XEROFORM PETROLAT GAUZE 1"X8" EX)    Apply 1 application topically 3 (three) times daily.   CELECOXIB (CELEBREX) 200 MG CAPSULE    Take by mouth 2 (two) times daily.    CHOLECALCIFEROL (VITAMIN D PO)    Take 5,000 Units by mouth daily.   FUROSEMIDE (LASIX) 40 MG TABLET    Take 1 tablet (40 mg total) by mouth daily.   HYDROXYCHLOROQUINE (PLAQUENIL) 200 MG TABLET    Take 200 mg by mouth 2 (two) times daily.    METHIMAZOLE (TAPAZOLE) 5 MG TABLET    Take 7.5 mg by mouth daily.    METHOTREXATE 2.5 MG TABLET    Take 20 mg by mouth once a week. Taking 8 tablets (2.5mg ) weekly   METOLAZONE (ZAROXOLYN) 5 MG TABLET    Take 5 mg by mouth daily as needed (for fluid. takes with furosemide).    METOPROLOL TARTRATE (LOPRESSOR) 25 MG TABLET    Take 25 mg by mouth 2 (two) times daily.    MULTIPLE VITAMINS-MINERALS (MULTIVITAMIN ADULT EXTRA C PO)    Take 1 tablet by mouth daily.    OXYCODONE (ROXICODONE) 15 MG IMMEDIATE RELEASE TABLET    Take 15 mg by mouth every 6 (six) hours as needed for pain.    PREDNISONE (DELTASONE) 10 MG TABLET    5-10 mg daily with breakfast.    PREDNISONE (DELTASONE) 5 MG TABLET    Take 5-10 tablets by mouth See admin instructions. Taking one 5mg  or one 10mg   tablet daily depending on arthritis symptoms   PROPYLENE GLYCOL (SYSTANE BALANCE) 0.6 % SOLN    Apply 1 drop to eye as needed (dry eyes).   WARFARIN (COUMADIN) 5 MG TABLET    Take 5 mg by mouth one time only at 6 PM.   Modified Medications   No medications on file  Discontinued Medications   No medications on file    Subjective: Shirley Waters is in for her hospital follow-up visit.  She developed recurrent venous stasis ulcers on both legs and was admitted to the hospital recently.  She had 2 prolonged hospitalizations in [place], [place] in the recent months and was on long courses of IV times.  When admitted here she had purulent exudate on all of her ulcers.  This was debrided by vascular surgery.  Operative cultures grew MRSA, Pseudomonas and E. coli.  The E. coli was multidrug resistant.  She received a few days of IV vancomycin and cefepime.  After debridement her ulcers looked very clean and we shifted the focus to local wound care and compression.  She has been seen by her home care nurse.  She has had some weeping from the largest ulcer on her right shin.  She tells me that her  nurse is going to increase her dressing changes to 3 times each week.  She was seen recently by her primary care provider, Dr. Hart Robinsons.  She has not decided where she will go for wound care yet.  She is thinking about transferring her wound care from [place] to [place].  Review of Systems: Review of Systems  Constitutional: Negative for chills, diaphoresis and fever.  Gastrointestinal: Negative for abdominal pain, diarrhea, nausea and vomiting.    Past Medical History:  Diagnosis Date  . Atrial fibrillation (HCC)   . CHF (congestive heart failure) (HCC)   . Chronic pain   . COPD (chronic obstructive pulmonary disease) (HCC)   . 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 DVT (deep venous thrombosis) (HCC)    history of, RLE  . Hypertension   . RA (rheumatoid arthritis) (HCC)     Social History    Tobacco Use  . Smoking status: Former Smoker    Years: 44.00    Last attempt to quit: 03/26/2008    Years since quitting: 9.9  . Smokeless tobacco: Never Used  Substance Use Topics  . Alcohol use: No    Alcohol/week: 0.0 standard drinks  . Drug use: No    Family History  Problem Relation Age of Onset  . Stroke Daughter     Allergies  Allergen Reactions  . Latex Rash  . Levofloxacin Palpitations    Objective: Vitals:   03/16/18 1043  BP: 123/65  Pulse: 64  Temp: 98.8 F (37.1 C)  TempSrc: Oral  Weight: 222 lb (100.7 kg)  Height: 5\' 5"  (1.651 m)   Body mass index is 36.94 kg/m.  Physical Exam  Constitutional: She is oriented to person, place, and time.  She is worried and at times tearful.  She is accompanied by a close friend who drove her here today.  Neurological: She is alert and oriented to person, place, and time.  Skin: No rash noted.  She has dry, hyperpigmented and thickened skin with chronic venous stasis dermatitis of both lower extremities.  She has a large irregular ulcer over her right anterior shin and a smaller ulcer over her left anterior shin.  There is some thin serous drainage from her right leg ulcer.  There is some yellow slough but no purulent drainage or odor.   Psychiatric: She has a normal mood and affect.    Lab Results    Problem List Items Addressed This Visit      High   Venous stasis ulcers (HCC)    She has chronic venous stasis ulcers without evidence of any active infection.  I had several long discussions with her recently about how difficult these can be to heal.  There is no indication for antibiotics at this time.  I feel very strongly that she needs to come back under the care of a wound care professional.  She very much wants to be seen outside of [place].  I would suggest that she try to establish care in [place].  I think the focus should continue to be on local wound care and compression.  I will be available to see  her as needed.          [place], MD [facility] for Infectious Disease [facility] [phone] pager   [phone] cell 03/16/2018, 2:11 PM

## 2018-03-21 ENCOUNTER — Emergency Department (HOSPITAL_COMMUNITY)
Admission: EM | Admit: 2018-03-21 | Discharge: 2018-03-21 | Disposition: A | Discharge status: Home / Self Care | Payer: Medicare Other | Attending: Emergency Medicine | Admitting: Emergency Medicine

## 2018-03-21 ENCOUNTER — Telehealth: Payer: Self-pay | Admitting: Behavioral Health

## 2018-03-21 ENCOUNTER — Encounter (HOSPITAL_COMMUNITY): Payer: Self-pay | Admitting: *Deleted

## 2018-03-21 ENCOUNTER — Other Ambulatory Visit: Payer: Self-pay

## 2018-03-21 ENCOUNTER — Emergency Department (HOSPITAL_COMMUNITY): Payer: Medicare Other

## 2018-03-21 DIAGNOSIS — R0602 Shortness of breath: Secondary | ICD-10-CM | POA: Diagnosis present

## 2018-03-21 DIAGNOSIS — I110 Hypertensive heart disease with heart failure: Secondary | ICD-10-CM | POA: Insufficient documentation

## 2018-03-21 DIAGNOSIS — Z7901 Long term (current) use of anticoagulants: Secondary | ICD-10-CM | POA: Insufficient documentation

## 2018-03-21 DIAGNOSIS — J449 Chronic obstructive pulmonary disease, unspecified: Secondary | ICD-10-CM | POA: Diagnosis not present

## 2018-03-21 DIAGNOSIS — Z87891 Personal history of nicotine dependence: Secondary | ICD-10-CM | POA: Diagnosis not present

## 2018-03-21 DIAGNOSIS — I10 Essential (primary) hypertension: Secondary | ICD-10-CM | POA: Diagnosis not present

## 2018-03-21 DIAGNOSIS — I509 Heart failure, unspecified: Secondary | ICD-10-CM | POA: Diagnosis not present

## 2018-03-21 DIAGNOSIS — Z79899 Other long term (current) drug therapy: Secondary | ICD-10-CM | POA: Insufficient documentation

## 2018-03-21 DIAGNOSIS — Z91040 Latex allergy status: Secondary | ICD-10-CM | POA: Insufficient documentation

## 2018-03-21 LAB — CBC WITH DIFFERENTIAL/PLATELET
BASOS ABS: 0 10*3/uL (ref 0.0–0.1)
Basophils Relative: 0 %
EOS ABS: 0.2 10*3/uL (ref 0.0–0.7)
Eosinophils Relative: 3 %
HCT: 38.2 % (ref 36.0–46.0)
HEMOGLOBIN: 8.8 g/dL — AB (ref 12.0–15.0)
Lymphocytes Relative: 12 %
Lymphs Abs: 0.7 10*3/uL (ref 0.7–4.0)
MCH: 26 pg (ref 26.0–34.0)
MCHC: 23 g/dL — AB (ref 30.0–36.0)
MCV: 112.7 fL — ABNORMAL HIGH (ref 78.0–100.0)
MONOS PCT: 4 %
Monocytes Absolute: 0.3 10*3/uL (ref 0.1–1.0)
NEUTROS ABS: 4.8 10*3/uL (ref 1.7–7.7)
NEUTROS PCT: 81 %
Platelets: 311 10*3/uL (ref 150–400)
RBC: 3.39 MIL/uL — ABNORMAL LOW (ref 3.87–5.11)
RDW: 18.9 % — ABNORMAL HIGH (ref 11.5–15.5)
WBC: 5.9 10*3/uL (ref 4.0–10.5)

## 2018-03-21 LAB — COMPREHENSIVE METABOLIC PANEL
ALBUMIN: 3.4 g/dL — AB (ref 3.5–5.0)
ALT: 10 U/L (ref 0–44)
ANION GAP: 11 (ref 5–15)
AST: 19 U/L (ref 15–41)
Alkaline Phosphatase: 58 U/L (ref 38–126)
BILIRUBIN TOTAL: 0.4 mg/dL (ref 0.3–1.2)
BUN: 24 mg/dL — ABNORMAL HIGH (ref 8–23)
CO2: 30 mmol/L (ref 22–32)
Calcium: 9.1 mg/dL (ref 8.9–10.3)
Chloride: 94 mmol/L — ABNORMAL LOW (ref 98–111)
Creatinine, Ser: 1.1 mg/dL — ABNORMAL HIGH (ref 0.44–1.00)
GFR calc Af Amer: 55 mL/min — ABNORMAL LOW (ref >60)
GFR calc non Af Amer: 48 mL/min — ABNORMAL LOW (ref >60)
GLUCOSE: 99 mg/dL (ref 70–99)
POTASSIUM: 3.5 mmol/L (ref 3.5–5.1)
Sodium: 135 mmol/L (ref 135–145)
TOTAL PROTEIN: 7.7 g/dL (ref 6.5–8.1)

## 2018-03-21 LAB — BRAIN NATRIURETIC PEPTIDE: B Natriuretic Peptide: 274 pg/mL — ABNORMAL HIGH (ref 0.0–100.0)

## 2018-03-21 LAB — TROPONIN I

## 2018-03-21 MED ORDER — FUROSEMIDE 10 MG/ML IJ SOLN
60.0000 mg | Freq: Once | INTRAMUSCULAR | Status: AC
  Administered 2018-03-21: 60 mg via INTRAVENOUS
  Filled 2018-03-21: qty 6

## 2018-03-21 NOTE — ED Triage Notes (Addendum)
PT c/o SOB that started around 1200 while sitting in her chair at home. PT denies any chest pain. PT took an extra dose of lasix at home piror to ED arrival.

## 2018-03-21 NOTE — ED Provider Notes (Signed)
[facility] EMERGENCY DEPARTMENT Provider Note   CSN: [id number] Arrival date & time: 03/21/18  1434     History   Chief Complaint Chief Complaint  Patient presents with  . Shortness of Breath    HPI Rayaan Shirley Waters is a 77 y.o. female.  HPI Presents with dyspnea. Patient has multiple issues, notes that she has been hospitalized for much over the past year. However, she has been home, in her usual state of health, with no notable recent changes until the past 3 hours. About that time she began to feel pressure sensation across her anterior chest, and a heaviness on her shoulders. There is associated dyspnea, but no syncope, no fever, cough. She took 1 extra dose of her Lasix, called a friend and was brought here for evaluation. She states that she feels about the same as she did at symptom onset, notes that she has had a heaviness on her shoulders intermittently over the past year or so. She denies other recent medication change, diet change. She also has history of venous stasis, has chronic nonhealing ulcerations of both legs, which were evaluated yesterday by home health, with fresh dressing applied to each leg. She is here with her companion who assists with the HPI. Past Medical History:  Diagnosis Date  . Atrial fibrillation (HCC)   . CHF (congestive heart failure) (HCC)   . Chronic pain   . COPD (chronic obstructive pulmonary disease) (HCC)   . 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 DVT (deep venous thrombosis) (HCC)    history of, RLE  . Hypertension   . RA (rheumatoid arthritis) [facility])     Patient Active Problem List   Diagnosis Date Noted  . Normocytic anemia 03/01/2018  . DJD (degenerative joint disease) 03/01/2018  . 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 History of DVT of lower extremity 03/01/2018  . Rheumatoid arthritis involving multiple joints (HCC)   . Permanent atrial fibrillation (HCC)   . Morbid obesity (HCC)   . Venous stasis  ulcers (HCC) 10/13/2017  . Rheumatoid arthritis (HCC) 10/13/2017  . Obesity 10/13/2017  . Degenerative disc disease, cervical 10/13/2017  . Lupus (HCC) 10/13/2017    Past Surgical History:  Procedure Laterality Date  . ABDOMINAL HYSTERECTOMY  1985  . cervical disectomy  04/10/2008   4 vertebrae  . choleycystectomy  1986  . knee replacement Bilateral 1971, 1972  . WOUND DEBRIDEMENT Bilateral 03/03/2018   Procedure: DEBRIDEMENT WOUNDS LOWER EXTREMITY;  Surgeon: Nada Libman, MD;  Location: [facility] OR;  Service: Vascular;  Laterality: Bilateral;     OB History    Gravida      Para      Term      Preterm      AB      Living  1     SAB      TAB      Ectopic      Multiple      Live Births               Home Medications    Prior to Admission medications   Medication Sig Start Date End Date Taking? Authorizing Provider  celecoxib (CELEBREX) 200 MG capsule Take 200 mg by mouth 2 (two) times daily.    Yes [provider]  Bismuth Tribromoph-Petrolatum (XEROFORM PETROLAT GAUZE 1"X8" EX) Apply 1 application topically 3 (three) times daily.    [provider]  Cholecalciferol (VITAMIN D PO) Take 10,000  Units by mouth daily.     [provider]  furosemide (LASIX) 40 MG tablet Take 1 tablet (40 mg total) by mouth daily. 03/07/18   Theotis Barrio, MD  hydroxychloroquine (PLAQUENIL) 200 MG tablet Take 200 mg by mouth 2 (two) times daily.  03/19/15   [provider]  methimazole (TAPAZOLE) 5 MG tablet Take 7.5 mg by mouth daily.     [provider]  methotrexate 2.5 MG tablet Take 20 mg by mouth once a week. Taking 8 tablets (2.5mg ) weekly    [provider]  metolazone (ZAROXOLYN) 5 MG tablet Take 5 mg by mouth daily as needed (for fluid. takes with furosemide).     [provider]  metoprolol tartrate (LOPRESSOR) 25 MG tablet Take 25 mg by mouth 2 (two) times daily.  03/19/15   [provider]  Multiple  Vitamins-Minerals (MULTIVITAMIN ADULT EXTRA C PO) Take 1 tablet by mouth daily.     [provider]  oxyCODONE (ROXICODONE) 15 MG immediate release tablet Take 15 mg by mouth every 6 (six) hours as needed for pain.     [provider]  predniSONE (DELTASONE) 10 MG tablet 5-10 mg daily with breakfast.     [provider]  predniSONE (DELTASONE) 5 MG tablet Take 5-10 tablets by mouth See admin instructions. Taking one 5mg  or one 10mg  tablet daily depending on arthritis symptoms 03/21/15   [provider]  Propylene Glycol (SYSTANE BALANCE) 0.6 % SOLN Apply 1 drop to eye as needed (dry eyes).    [provider]  warfarin (COUMADIN) 5 MG tablet Take 5 mg by mouth one time only at 6 PM.  03/05/15   [provider]    Family History Family History  Problem Relation Age of Onset  . Stroke Daughter     Social History Social History   Tobacco Use  . Smoking status: Former Smoker    Years: 44.00    Last attempt to quit: 03/26/2008    Years since quitting: 9.9  . Smokeless tobacco: Never Used  Substance Use Topics  . Alcohol use: No    Alcohol/week: 0.0 standard drinks  . Drug use: No     Allergies   Soy allergy; Latex; and Levofloxacin   Review of Systems Review of Systems  Constitutional:       Per HPI, otherwise negative  HENT:       Per HPI, otherwise negative  Respiratory:       Per HPI, otherwise negative  Cardiovascular:       Per HPI, otherwise negative  Gastrointestinal: Negative for vomiting.  Endocrine:       Negative aside from HPI  Genitourinary:       Neg aside from HPI   Musculoskeletal:       Per HPI, otherwise negative  Skin: Positive for wound.  Neurological: Positive for weakness and light-headedness. Negative for syncope.     Physical Exam Updated Vital Signs BP (!) 108/55 (BP Location: Left Arm)   Pulse 61   Temp 99 F (37.2 C) (Oral)   Resp 20   Ht 5\' 5"  (1.651 m)   Wt 106.6 kg   SpO2 96%    BMI 39.11 kg/m   Physical Exam  Constitutional: She is oriented to person, place, and time. She appears well-developed and well-nourished. No distress.  HENT:  Head: Normocephalic and atraumatic.  Eyes: Conjunctivae and EOM are normal.  Cardiovascular: Normal rate and regular rhythm.  Pulmonary/Chest: Effort  normal and breath sounds normal. No stridor. No respiratory distress.  Abdominal: She exhibits no distension.  Musculoskeletal: She exhibits no edema.  Neurological: She is alert and oriented to person, place, and time. No cranial nerve deficit.  Skin: Skin is warm and dry.  I reviewed pictures of the chronic wounds from yesterday's home health visit, each leg looks chronic, but with appropriate healing characteristics. There is no proximal erythema around the dressings in place. Patient denies complaints regarding her lower legs.  Psychiatric: She has a normal mood and affect.  Nursing note and vitals reviewed.    ED Treatments / Results  Labs (all labs ordered are listed, but only abnormal results are displayed) Labs Reviewed  COMPREHENSIVE METABOLIC PANEL - Abnormal; Notable for the following components:      Result Value   Chloride 94 (*)    BUN 24 (*)    Creatinine, Ser 1.10 (*)    Albumin 3.4 (*)    GFR calc non Af Amer 48 (*)    GFR calc Af Amer 55 (*)    All other components within normal limits  CBC WITH DIFFERENTIAL/PLATELET - Abnormal; Notable for the following components:   RBC 3.39 (*)    Hemoglobin 8.8 (*)    MCV 112.7 (*)    MCHC 23.0 (*)    RDW 18.9 (*)    All other components within normal limits  BRAIN NATRIURETIC PEPTIDE - Abnormal; Notable for the following components:   B Natriuretic Peptide 274.0 (*)    All other components within normal limits  TROPONIN I    EKG EKG Interpretation  Date/Time:  Tuesday March 21 2018 15:05:04 EDT Ventricular Rate:  76 PR Interval:    QRS Duration: 101 QT Interval:  425 QTC Calculation: 421 R  Axis:   -33 Text Interpretation:  Sinus rhythm Multiform ventricular premature complexes Left axis deviation Low voltage, precordial leads Artifact T wave abnormality Abnormal ekg Confirmed by Gerhard Munch [phone]) on 03/21/2018 3:27:00 PM   Radiology Dg Chest 2 View  Result Date: 03/21/2018 CLINICAL DATA:  Onset of shortness of breath this morning. History of atrial fibrillation, CHF, COPD, former smoker. EXAM: CHEST - 2 VIEW COMPARISON:  Chest x-ray of Nov 27, 2017 FINDINGS: The lungs are mildly hyperinflated. There is no focal infiltrate. The cardiac silhouette is enlarged. The pulmonary vascularity is mildly engorged with cephalization. There is calcification in the wall of the aortic arch. The trachea is midline. The bony thorax exhibits no acute abnormality. There are degenerative changes of both shoulders. IMPRESSION: Mild CHF superimposed upon COPD and smoking related changes. No pneumonia. Thoracic aortic atherosclerosis. Electronically Signed   By: David  [place] M.D.   On: 03/21/2018 15:33    Procedures Procedures (including critical care time)  Medications Ordered in ED Medications  furosemide (LASIX) injection 60 mg (60 mg Intravenous Given 03/21/18 1645)     Initial Impression / Assessment and Plan / ED Course  I have reviewed the triage vital signs and the nursing notes.  Pertinent labs & imaging results that were available during my care of the patient were reviewed by me and considered in my medical decision making (see chart for details).     4:55 PM Repeat exam the patient is in similar condition, awake, alert, no hypoxia, no increased work of breathing. Labs reassuring, and discussed with her at length. Specifically we discussed the mild elevation in BNP.  We discussed IV Lasix, which has been provided, and increased dosing at home, with  outpatient follow-up. Without evidence for other new pathology, no evidence for ACS, pneumonia, bacteremia, sepsis, and with no  distress, with unremarkable vital signs, the patient is appropriate for close outpatient follow-up.  Final Clinical Impressions(s) / ED Diagnoses  Heart failure exacerbation   Gerhard Munch, MD 03/21/18 1656

## 2018-03-21 NOTE — Discharge Instructions (Addendum)
As discussed, your evaluation today has been largely reassuring.  But, it is important that you monitor your condition carefully, and do not hesitate to return to the ED if you develop new, or concerning changes in your condition.  For each of the next 4 days, September 11 through September 14 please take 60 mg of Lasix rather than your typical dose of 40 mg.  You may then return to your typical dosing regimen.  Please follow-up with your physician for appropriate ongoing care.

## 2018-03-21 NOTE — Telephone Encounter (Signed)
Patient called stating she spoke with her PCP Dr. Hart Robinsons and would like to see wound care in [facility]) and not in [place].  She states she will be able to travel to [place] and she has talked to her family and they believe this is a good decision. Angeline Slim RN

## 2018-03-29 ENCOUNTER — Telehealth: Payer: Self-pay | Admitting: *Deleted

## 2018-03-29 NOTE — Telephone Encounter (Signed)
First available 15-minute slot.

## 2018-03-29 NOTE — Telephone Encounter (Signed)
Schedulers notified and will call and make an office visit for patient.

## 2018-03-29 NOTE — Telephone Encounter (Signed)
Patient called to advise she would like to seen Dr Orvan Falconer for follow up. After review of chart not sure that is necessary and if so what the time frame for follow should be as no reference to next visit in last note. Advised her will ask the doctor and give her a call back.

## 2018-04-18 ENCOUNTER — Ambulatory Visit (INDEPENDENT_AMBULATORY_CARE_PROVIDER_SITE_OTHER): Payer: Medicare Other | Admitting: Internal Medicine

## 2018-04-18 ENCOUNTER — Encounter: Payer: Self-pay | Admitting: Internal Medicine

## 2018-04-18 DIAGNOSIS — L97209 Non-pressure chronic ulcer of unspecified calf with unspecified severity: Secondary | ICD-10-CM | POA: Diagnosis not present

## 2018-04-18 DIAGNOSIS — I83002 Varicose veins of unspecified lower extremity with ulcer of calf: Secondary | ICD-10-CM | POA: Diagnosis present

## 2018-04-18 NOTE — Assessment & Plan Note (Signed)
She describes steady improvement in her venous stasis ulcers.  This is attributable to consistent compression.  She is does not need any further evaluation or treatment for infection at this time.  She would like to follow-up here in 6 weeks.

## 2018-04-18 NOTE — Progress Notes (Signed)
[facility]  Patient Active Problem List   Diagnosis Date Noted  . Venous stasis ulcers (HCC) 10/13/2017    Priority: High  . Normocytic anemia 03/01/2018  . DJD (degenerative joint disease) 03/01/2018  . 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 History of DVT of lower extremity 03/01/2018  . Rheumatoid arthritis involving multiple joints (HCC)   . Permanent atrial fibrillation   . Morbid obesity (HCC)   . Rheumatoid arthritis (HCC) 10/13/2017  . Obesity 10/13/2017  . Degenerative disc disease, cervical 10/13/2017  . Lupus (HCC) 10/13/2017    Patient's Medications  New Prescriptions   No medications on file  Previous Medications   BISMUTH TRIBROMOPH-PETROLATUM (XEROFORM PETROLAT GAUZE 1"X8" EX)    Apply 1 application topically 3 (three) times daily.   CELECOXIB (CELEBREX) 200 MG CAPSULE    Take 200 mg by mouth 2 (two) times daily.    CHOLECALCIFEROL (VITAMIN D PO)    Take 10,000 Units by mouth daily.    FUROSEMIDE (LASIX) 40 MG TABLET    Take 1 tablet (40 mg total) by mouth daily.   HYDROXYCHLOROQUINE (PLAQUENIL) 200 MG TABLET    Take 200 mg by mouth 2 (two) times daily.    METHIMAZOLE (TAPAZOLE) 5 MG TABLET    Take 7.5 mg by mouth daily.    METHOTREXATE 2.5 MG TABLET    Take 20 mg by mouth once a week. Taking 8 tablets (2.5mg ) weekly   METOLAZONE (ZAROXOLYN) 5 MG TABLET    Take 5 mg by mouth daily as needed (for fluid. takes with furosemide).    METOPROLOL TARTRATE (LOPRESSOR) 25 MG TABLET    Take 25 mg by mouth 2 (two) times daily.    MULTIPLE VITAMINS-MINERALS (MULTIVITAMIN ADULT EXTRA C PO)    Take 1 tablet by mouth daily.    OXYCODONE (ROXICODONE) 15 MG IMMEDIATE RELEASE TABLET    Take 15 mg by mouth every 6 (six) hours as needed for pain.    PREDNISONE (DELTASONE) 10 MG TABLET    5-10 mg daily with breakfast.    PREDNISONE (DELTASONE) 5 MG TABLET    Take 5-10 tablets by mouth See admin instructions. Taking one 5mg  or one  10mg  tablet daily depending on arthritis symptoms   PROPYLENE GLYCOL (SYSTANE BALANCE) 0.6 % SOLN    Apply 1 drop to eye as needed (dry eyes).   WARFARIN (COUMADIN) 5 MG TABLET    Take 5 mg by mouth one time only at 6 PM.   Modified Medications   No medications on file  Discontinued Medications   No medications on file    Subjective: Ms. Dern is in for her follow-up visit.  She developed recurrent venous stasis ulcers on both legs and was admitted to the hospital recently.  She had 2 prolonged hospitalizations in [place] in the recent months and was on long courses of IV times.  When admitted here she had purulent exudate on all of her ulcers.  This was debrided by vascular surgery.  Operative cultures grew MRSA, Pseudomonas and E. coli.  The E. coli was multidrug resistant.  She received a few days of IV vancomycin and cefepime.  After debridement her ulcers looked very clean and we shifted the focus to local wound care and compression.  Her home care nurse has been applying a new pressure dressings to both lower extremities 3 times each week.  She tells me that she is doing much  better.  Many of her smaller ulcers have healed completely.  All of the other ones are improving.  She has lost 11 pounds.  Review of Systems: Review of Systems  Constitutional: Negative for chills, diaphoresis and fever.  Gastrointestinal: Positive for constipation. Negative for abdominal pain, diarrhea, nausea and vomiting.    Past Medical History:  Diagnosis Date  . Atrial fibrillation (HCC)   . CHF (congestive heart failure) (HCC)   . Chronic pain   . COPD (chronic obstructive pulmonary disease) (HCC)   . 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 DVT (deep venous thrombosis) (HCC)    history of, RLE  . Hypertension   . RA (rheumatoid arthritis) (HCC)     Social History   Tobacco Use  . Smoking status: Former Smoker    Years: 44.00    Last attempt to quit: 03/26/2008    Years since  quitting: 10.0  . Smokeless tobacco: Never Used  Substance Use Topics  . Alcohol use: No    Alcohol/week: 0.0 standard drinks  . Drug use: No    Family History  Problem Relation Age of Onset  . Stroke Daughter     Allergies  Allergen Reactions  . Soy Allergy Other (See Comments)    Joint pain  . Latex Rash  . Levofloxacin Palpitations    Objective: Vitals:   04/18/18 1550  BP: 101/74  Pulse: (!) 142  Temp: 98.5 F (36.9 C)  Weight: 211 lb 1.9 oz (95.8 kg)   Body mass index is 35.13 kg/m.  Physical Exam  Constitutional: She is oriented to person, place, and time.  She is accompanied by her friend, Bebe.  She is in very good spirits.  Our scales show that she has lost 42 pounds since April.  Neurological: She is alert and oriented to person, place, and time.  Skin: No rash noted.  She has knee-high Unna boots on both feet and lower legs.  I did not remove them.  Psychiatric: She has a normal mood and affect.    Lab Results    Problem List Items Addressed This Visit      High   Venous stasis ulcers (HCC)    She describes steady improvement in her venous stasis ulcers.  This is attributable to consistent compression.  She is does not need any further evaluation or treatment for infection at this time.  She would like to follow-up here in 6 weeks.          Cliffton Asters, MD [facility] for Infectious Disease [facility] [phone] pager   [phone] cell 04/18/2018, 4:29 PM

## 2018-05-30 ENCOUNTER — Encounter: Payer: Self-pay | Admitting: Internal Medicine

## 2018-05-30 ENCOUNTER — Ambulatory Visit (INDEPENDENT_AMBULATORY_CARE_PROVIDER_SITE_OTHER): Payer: Medicare Other | Admitting: Internal Medicine

## 2018-05-30 DIAGNOSIS — L97209 Non-pressure chronic ulcer of unspecified calf with unspecified severity: Secondary | ICD-10-CM

## 2018-05-30 DIAGNOSIS — I83002 Varicose veins of unspecified lower extremity with ulcer of calf: Secondary | ICD-10-CM | POA: Diagnosis present

## 2018-05-30 NOTE — Assessment & Plan Note (Signed)
Her chronic venous stasis ulcers continue to heal through a combination of compression and weight loss.  She has no evidence of active infection.  She can follow-up here as needed.

## 2018-05-30 NOTE — Progress Notes (Signed)
[facility]  Patient Active Problem List   Diagnosis Date Noted  . Venous stasis ulcers (HCC) 10/13/2017    Priority: High  . Normocytic anemia 03/01/2018  . DJD (degenerative joint disease) 03/01/2018  . 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 History of DVT of lower extremity 03/01/2018  . Rheumatoid arthritis involving multiple joints (HCC)   . Permanent atrial fibrillation   . Morbid obesity (HCC)   . Rheumatoid arthritis (HCC) 10/13/2017  . Obesity 10/13/2017  . Degenerative disc disease, cervical 10/13/2017  . Lupus (HCC) 10/13/2017    Patient's Medications  New Prescriptions   No medications on file  Previous Medications   BISMUTH TRIBROMOPH-PETROLATUM (XEROFORM PETROLAT GAUZE 1"X8" EX)    Apply 1 application topically 3 (three) times daily.   CELECOXIB (CELEBREX) 200 MG CAPSULE    Take 200 mg by mouth 2 (two) times daily.    CHOLECALCIFEROL (VITAMIN D PO)    Take 10,000 Units by mouth daily.    FUROSEMIDE (LASIX) 40 MG TABLET    Take 1 tablet (40 mg total) by mouth daily.   HYDROXYCHLOROQUINE (PLAQUENIL) 200 MG TABLET    Take 200 mg by mouth 2 (two) times daily.    METHIMAZOLE (TAPAZOLE) 5 MG TABLET    Take 7.5 mg by mouth daily.    METHOTREXATE 2.5 MG TABLET    Take 20 mg by mouth once a week. Taking 8 tablets (2.5mg ) weekly   METOLAZONE (ZAROXOLYN) 5 MG TABLET    Take 5 mg by mouth daily as needed (for fluid. takes with furosemide).    METOPROLOL TARTRATE (LOPRESSOR) 25 MG TABLET    Take 25 mg by mouth 2 (two) times daily.    MULTIPLE VITAMINS-MINERALS (MULTIVITAMIN ADULT EXTRA C PO)    Take 1 tablet by mouth daily.    OXYCODONE (ROXICODONE) 15 MG IMMEDIATE RELEASE TABLET    Take 15 mg by mouth every 6 (six) hours as needed for pain.    PREDNISONE (DELTASONE) 5 MG TABLET    Take 5-10 tablets by mouth See admin instructions. Taking one 5mg  or one 10mg  tablet daily depending on arthritis symptoms   PROPYLENE GLYCOL  (SYSTANE BALANCE) 0.6 % SOLN    Apply 1 drop to eye as needed (dry eyes).   WARFARIN (COUMADIN) 5 MG TABLET    Take 5 mg by mouth one time only at 6 PM.   Modified Medications   No medications on file  Discontinued Medications   PREDNISONE (DELTASONE) 10 MG TABLET    5-10 mg daily with breakfast.     Subjective: Shirley Waters is in for her follow-up visit.  She developed recurrent venous stasis ulcers on both legs and was admitted to the hospital recently.  She had 2 prolonged hospitalizations in [place] in the recent months and was on long courses of IV times.  When admitted here she had purulent exudate on all of her ulcers.  This was debrided by vascular surgery.  Operative cultures grew MRSA, Pseudomonas and E. coli.  The E. coli was multidrug resistant.  She received a few days of IV vancomycin and cefepime.  After debridement her ulcers looked very clean and we shifted the focus to local wound care and compression.  She continues to lose weight with diuretic therapy and diet.  The ulcers on her left leg have healed completely.  She only has 1 small ulcer left on her right shin and it  continues to improve.  Review of Systems: Review of Systems  Constitutional: Negative for chills, diaphoresis and fever.  Gastrointestinal: Positive for constipation. Negative for abdominal pain, diarrhea, nausea and vomiting.    Past Medical History:  Diagnosis Date  . Atrial fibrillation (HCC)   . CHF (congestive heart failure) (HCC)   . Chronic pain   . COPD (chronic obstructive pulmonary disease) (HCC)   . 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 DVT (deep venous thrombosis) (HCC)    history of, RLE  . Hypertension   . RA (rheumatoid arthritis) (HCC)     Social History   Tobacco Use  . Smoking status: Former Smoker    Years: 44.00    Last attempt to quit: 03/26/2008    Years since quitting: 10.1  . Smokeless tobacco: Never Used  Substance Use Topics  . Alcohol use: No     Alcohol/week: 0.0 standard drinks  . Drug use: No    Family History  Problem Relation Age of Onset  . Stroke Daughter     Allergies  Allergen Reactions  . Soy Allergy Other (See Comments)    Joint pain  . Latex Rash  . Levofloxacin Palpitations    Objective: Vitals:   05/30/18 1028  BP: 108/72  Pulse: (!) 103  Temp: 97.7 F (36.5 C)  Weight: 208 lb (94.3 kg)   Body mass index is 34.61 kg/m.  Physical Exam  Constitutional: She is oriented to person, place, and time.  She is accompanied by her friend, Bebe.  She is in very good spirits.  Our scales show that she has lost 45 pounds since April.  Neurological: She is alert and oriented to person, place, and time.  Skin: No rash noted.  She has a compression stocking on her left leg and an Unna boot on her right leg.  A picture taken yesterday shows a 1 cm superficial ulcer of her right shin without evidence of infection.  Psychiatric: She has a normal mood and affect.    Lab Results    Problem List Items Addressed This Visit      High   Venous stasis ulcers (HCC)    Her chronic venous stasis ulcers continue to heal through a combination of compression and weight loss.  She has no evidence of active infection.  She can follow-up here as needed.          Cliffton Asters, MD [facility] for Infectious Disease [facility] [phone] pager   [phone] cell 05/30/2018, 10:57 AM

## 2018-08-03 ENCOUNTER — Other Ambulatory Visit: Payer: Self-pay | Admitting: Neurosurgery

## 2018-08-03 DIAGNOSIS — G959 Disease of spinal cord, unspecified: Secondary | ICD-10-CM

## 2018-08-15 ENCOUNTER — Ambulatory Visit
Admission: RE | Admit: 2018-08-15 | Discharge: 2018-08-15 | Disposition: A | Discharge status: Home / Self Care | Payer: Medicare Other | Source: Ambulatory Visit | Attending: Neurosurgery | Admitting: Neurosurgery

## 2018-08-15 DIAGNOSIS — G959 Disease of spinal cord, unspecified: Secondary | ICD-10-CM

## 2018-10-31 ENCOUNTER — Telehealth: Payer: Self-pay

## 2018-10-31 NOTE — Telephone Encounter (Signed)
Patient called wanted to let MD ware of current inpatient rehab stay at [facility]. Patient states she was originally admitted to the hospital for increased pian in left leg. Patient agrees to telephone Evisit. Patient can be reached at [phone] room number) Valarie Cones, LPN

## 2018-11-01 ENCOUNTER — Encounter: Payer: Self-pay | Admitting: Internal Medicine

## 2018-11-01 ENCOUNTER — Ambulatory Visit (INDEPENDENT_AMBULATORY_CARE_PROVIDER_SITE_OTHER): Payer: Medicare Other | Admitting: Internal Medicine

## 2018-11-01 ENCOUNTER — Other Ambulatory Visit: Payer: Self-pay

## 2018-11-01 DIAGNOSIS — L03031 Cellulitis of right toe: Secondary | ICD-10-CM | POA: Insufficient documentation

## 2018-11-01 NOTE — Progress Notes (Signed)
Virtual Visit via Telephone Note  I connected with Shirley Waters on 11/01/18 at 10:00 AM EDT by telephone and verified that I am speaking with the correct person using two identifiers.   I discussed the limitations, risks, security and privacy concerns of performing an evaluation and management service by telephone and the availability of in person appointments. I also discussed with the patient that there may be a patient responsible charge related to this service. The patient expressed understanding and agreed to proceed.   History of Present Illness: I called and spoke with [name] by phone today.  Several months ago she began to develop calluses on the bottom of both great toes.  In mid February she noticed some drainage from her right great toe and started seeing her podiatrist, Dr. Allena KatzPatel.  She had debridement of both toes performed.  Dr. Allena KatzPatel described hemorrhagic hyperkeratotic lesions bilaterally.  At the time of her initial visit on 09/05/2018 there was no purulent drainage noted and no odor.  There was no redness.  A culture of the right toe was obtained.  Gram stain showed gram-negative rods and gram-positive cocci and cultures grew group B strep.  When she was seen back on 09/19/2018 there was some mild drainage from the right great toe.  There is no exposed bone and x-rays did not show any evidence of osteomyelitis.  She was prescribed amoxicillin clavulanate on 09/19/2018 but had difficulty swallowing the large tablet.  She was switched to doxycycline and took it for 10 days.  A little over 1 week ago she was asleep in her chair with her legs up on an ottoman.  Her left leg fell off the ottoman and struck the floor.  She had pain and difficulty walking.  She was evaluated with a CT scan which showed a popliteal cyst and DVT in her left leg.  She was admitted and is now in a rehab facility.  She has had some problems with atrial fibrillation.  Her wound care nurse evaluated her yesterday.  She  was told that her left toe had no drainage and appears to have healed.  However, her right great toe was more red, tender and had a small drop of purulent drainage.  Apparently there is a question of some tunneling under the ulcer.  Repeat x-ray did not show any evidence of osteomyelitis.  She remains afebrile with a normal white blood cell count.   Observations/Objective:   Assessment and Plan: [name] has developed a soft tissue infection of her right great toe.  I spoke to her physician, Dr. Alger Memosavid Richmond, at her facility and recommended IV ampicillin sulbactam.  She is scheduled to be there through 11/07/2018.  He is going to send a specimen of the purulent drainage for repeat culture.  I asked him to call me if she is not improving or if the culture reveals any organisms resistant to ampicillin sulbactam.  If she improves I suggested discharging her on liquid amoxicillin clavulanate to complete at least 2 weeks of total therapy.  Follow Up Instructions: Start IV ampicillin sulbactam Repeat wound drainage cultures   I discussed the assessment and treatment plan with the patient. The patient was provided an opportunity to ask questions and all were answered. The patient agreed with the plan and demonstrated an understanding of the instructions.   The patient was advised to call back or seek an in-person evaluation if the symptoms worsen or if the condition fails to improve as anticipated.  I  provided 16 minutes of non-face-to-face time during this encounter.   Cliffton Asters, MD

## 2018-11-22 ENCOUNTER — Telehealth: Payer: Self-pay

## 2018-11-22 NOTE — Telephone Encounter (Signed)
Home Health called to make MD aware that  patient has new open area to left second toe measuring 1.0 x 1.0 x 0.2 with  yellow center and pink edges. Surrounding skin is macerated. Drainage includes serous drainage, no odors. Area has always been soar due to hammer toe. Home Health would like MD approval treatment:  Clean with NS apply antibiotic ointment and cover with dry dressing every Monday, Wednesday ,and  Friday. If no improvement by Friday ok to stitch Aquacel dressing. Routing message to MD for advise.  Valarie Cones, LPN

## 2018-11-22 NOTE — Telephone Encounter (Signed)
I feel it would be more appropriate for those orders to come from her PCP or her podiatrist.

## 2018-11-27 ENCOUNTER — Telehealth: Payer: Self-pay | Admitting: *Deleted

## 2018-11-27 NOTE — Telephone Encounter (Signed)
Nurse doing wound care for patient in the home called to ask if she could change the patient ointment to Aquasil AG as the wound has gotten worse and she thinks this will work better for her and needs an ok in order to make the change. Advised will ask the provider and give her a call back once he responds.  [phone] Sherene Sires RN

## 2018-11-27 NOTE — Telephone Encounter (Signed)
I would suggest that all wound care be managed by PCP or other local providers that she may be seeing.

## 2018-12-12 ENCOUNTER — Telehealth: Payer: Self-pay | Admitting: *Deleted

## 2018-12-12 NOTE — Telephone Encounter (Signed)
Patient called for advice. She still has wounds present, is not on antibiotics any longer, is about to lose her home health nurse visits. She is scared most about losing nursing. RN advised patient to start with her podiatrist to see if she needs their care or referrals to wound care/vascular/infectious disease.  She verbalized understanding, will call [facility] in [place]. Andree Coss, RN

## 2018-12-20 ENCOUNTER — Telehealth: Payer: Self-pay | Admitting: *Deleted

## 2018-12-20 NOTE — Telephone Encounter (Signed)
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  She recently saw her podiatrist, Dr. Posey Pronto, on 12/14/2018.  Review of his office note indicates that she had a full-thickness ulceration on the plantar aspect of her right great toe.  It measured 0.4 x 0.4 x 0.4 cm.  He described the ulcer as having a granular wound bed with macerated edges and serous drainage.  It did not probe to bone and no warmth was noted.  There was also a full-thickness ulcer on the medial aspect of her left great toe measuring 2.3 x 0.3 x 0.2 with fibrogranular wound base.  It did not probe to bone and no warmth was noted.  He did not detect any odor from her wounds.  Swab cultures were obtained from both wounds.  A culture from her left foot ulcer grew methicillin-resistant staph aureus susceptible to tetracycline, trimethoprim sulfamethoxazole and vancomycin.  The other culture which I presume was from her right foot ulcer grew Morganella and pseudomonas aeruginosa.  Both isolates are susceptible to ciprofloxacin and a variety of IV antibiotics.  Her allergy list indicates that she has had palpitations while on levofloxacin.  She is not having any fever, chills or sweats.  She is worried that the wounds are not getting better but does not feel that they are necessarily getting any worse recently.  She is scheduled to see Dr. Kennieth Rad, and infectious disease physician in [place], [place], tomorrow morning.  She is worried that they will force her to go back into a nursing home.  I reminded her that she has the right to decide whether or not any recommendation by her physicians makes sense to her.  I told her that I would call her tomorrow afternoon to discuss how the visit went.

## 2018-12-20 NOTE — Telephone Encounter (Signed)
Neleh is asking for Dr Hale Bogus advice re: new culture results.

## 2018-12-21 ENCOUNTER — Telehealth: Payer: Self-pay | Admitting: Internal Medicine
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
## 2019-01-01 ENCOUNTER — Telehealth: Payer: Self-pay | Admitting: *Deleted

## 2019-01-01 NOTE — Telephone Encounter (Signed)
Patient called to advise she is having someone bring her to [facility] ED in the morning and she wanted Megan Salon to know.

## 2020-10-17 ENCOUNTER — Other Ambulatory Visit: Payer: Self-pay | Admitting: Neurosurgery

## 2020-10-17 DIAGNOSIS — G959 Disease of spinal cord, unspecified: Secondary | ICD-10-CM

## 2020-11-14 ENCOUNTER — Other Ambulatory Visit: Payer: Self-pay

## 2020-11-14 ENCOUNTER — Ambulatory Visit
Admission: RE | Admit: 2020-11-14 | Discharge: 2020-11-14 | Disposition: A | Discharge status: Home / Self Care | Payer: Medicare Other | Source: Ambulatory Visit | Attending: Neurosurgery | Admitting: Neurosurgery

## 2020-11-14 DIAGNOSIS — G959 Disease of spinal cord, unspecified: Secondary | ICD-10-CM

## 2021-01-28 ENCOUNTER — Other Ambulatory Visit: Payer: Self-pay | Admitting: Neurosurgery

## 2021-02-26 ENCOUNTER — Encounter (HOSPITAL_COMMUNITY): Payer: Self-pay | Admitting: Neurosurgery

## 2021-02-26 NOTE — Pre-Procedure Instructions (Signed)
Shirley Waters  02/26/2021      Procedure is scheduled on Monday, August 22.  Report to [facility], Main Entrance or Entrance "A" at 5:30 AM .        Surgery is scheduled to begin at 7:30 AM   >>>>>Please send patient's Medication Record with medications administrated documentation. ( this information is required prior to OR. This includes medications that may have been on hold for surgery)<<<<<    Remember:  Do not eat or drink after midnight.   Take these medicines the morning of surgery with A SIP OF WATER:  Cardizem, Digoxin, Esomeprazoole, Methimazole, Metoprolol, Plaqueil.  Prn: Albuterol inhaler Eye drops  As of today :STOP Aspirin Products (Goody Powder, Excedrin Migraine), Ibuprofen (Advil), Naproxen (Aleve), Vitamins and Herbal Products (ie Fish Oil).   Shirley Waters should have a shower, with antibacteria soap, the morning of surgery . Dry off with a clean towel.  Patient should not have lotions, powders, colognes, deodorant, jewelry, or piercing's, nail polish, artifical or acrylic nails.. Wear clean comfortable clothes.  Brush teeth.   Do not bring valuables to the hospital.Shirley Waters is not responsible for any belongings or valuables. Contacts, dentures or bridgework may not be worn into surgery. If you wear glasses or dentures- please bring a glasses case make sure dentures can be removed, we will put the denture in a denture cup.                Special instructions:    [facility]- Preparing For Surgery  Before surgery, you can play an important role. Because skin is not sterile, your skin needs to be as free of germs as possible. You can reduce the number of germs on your skin by washing with CHG (chlorahexidine gluconate) Soap before surgery.  CHG is an antiseptic cleaner which kills germs and bonds with the skin to continue killing germs even after washing.    Oral Hygiene is also important to reduce your risk of infection.  Remember - BRUSH  YOUR TEETH THE MORNING OF SURGERY WITH YOUR REGULAR TOOTHPASTE  Please do not use if you have an allergy to CHG or antibacterial soaps. If your skin becomes reddened/irritated stop using the CHG.  Do not shave (including legs and underarms) for at least 48 hours prior to first CHG shower. It is OK to shave your face.  Please follow these instructions carefully.   Shower the NIGHT BEFORE SURGERY and the MORNING OF SURGERY with CHG.   If you chose to wash your hair, wash your hair first as usual with your normal shampoo.  After you shampoo, wash your face and private area with the soap you use at home, then rinse your hair and body thoroughly to remove the shampoo and soap.  Use CHG as you would any other liquid soap. You can apply CHG directly to the skin and wash gently with a scrungie or a clean washcloth.   Apply the CHG Soap to your body ONLY FROM THE NECK DOWN.  Do not use on open wounds or open sores. Avoid contact with your eyes, ears, mouth and genitals (private parts).   Wash thoroughly, paying special attention to the area where your surgery will be performed.  Thoroughly rinse your body with warm water from the neck down.  DO NOT shower/wash with your normal soap after using and rinsing off the CHG Soap.  Pat yourself dry with a CLEAN TOWEL.  Wear CLEAN PAJAMAS to bed the  night before surgery, wear comfortable clothes the morning of surgery  Place CLEAN SHEETS on your bed the night of your first shower and DO NOT SLEEP WITH PETS.  Day of Surgery: Shower as instructed above. Do not apply any deodorants/lotions, powders or colognes.  Please wear clean clothes to the hospital/surgery center.   Remember to brush your teeth WITH YOUR REGULAR TOOTHPASTE.  Do not wear jewelry, make-up or nail polish.  Do not shave 48 hours prior to surgery.  Men may shave face and neck.  Do not bring valuables to the hospital.  [facility] is not responsible for any belongings or  valuables.  Contacts, dentures or bridgework may not be worn into surgery.  Leave your suitcase in the car.  After surgery it may be brought to your room.      Do not apply any deodorants/lotions, powders or colognes.

## 2021-02-26 NOTE — Progress Notes (Addendum)
Shirley Waters is a patient at [facility] [place], Dr. Lonie Peak scheduler. I called and said that patient needs labs drawn before Monday, per [facility] she Is a first case. I notified Dondra Spry- PAT scheduler, Dondra Spry call the SNF and offer the 2 times she has open on Friday, the center will not be able to bring patient at that time. Lanora Manis, RN,charge nurse from the SNF called and said they can draw blood do EKG, whatever we need before surgery.  I spoke to Antionette Poles, PA-C., to see what patient will need and asked him to send a order to the SNF. I spoke with Lanora Manis and asked what MD patient sees and has Shirley. Traxler have a cardiologist and if she has an ECHO in her records. No cardiologist , no ECHO. Last office visit was in January of 2022, I asked for the notes and lab work, I also asked for medication record to be sent to  Antionette Poles, PA-C. Lanora Manis reported that the letter from Dr. Lonie Peak office said to stop Eliquis 3 - 7 days prior to surgery. Last dose was on Monday, August 15.

## 2021-02-27 NOTE — Anesthesia Preprocedure Evaluation (Addendum)
Anesthesia Evaluation  Patient identified by MRN, date of birth, ID band Patient awake    Reviewed: Allergy & Precautions, NPO status , Patient's Chart, lab work & pertinent test results, reviewed documented beta blocker date and time   Airway Mallampati: III  TM Distance: >3 FB Neck ROM: Limited    Dental  (+) Edentulous Upper, Edentulous Lower, Dental Advisory Given   Pulmonary COPD,  COPD inhaler, former smoker,    Pulmonary exam normal breath sounds clear to auscultation       Cardiovascular hypertension, Pt. on medications and Pt. on home beta blockers +CHF and + DVT  Normal cardiovascular exam+ dysrhythmias Atrial Fibrillation  Rhythm:Regular Rate:Normal  ECG: SB, rate 57   Neuro/Psych PSYCHIATRIC DISORDERS Depression    GI/Hepatic negative GI ROS, Neg liver ROS,   Endo/Other  negative endocrine ROS  Renal/GU negative Renal ROS     Musculoskeletal  (+) Arthritis , Rheumatoid disorders,  Venous stasis ulcers Wheelchair bound   Abdominal (+) + obese,   Peds  Hematology  (+) anemia ,   Anesthesia Other Findings Cervical Myelopathy  Reproductive/Obstetrics                          Anesthesia Physical Anesthesia Plan  ASA: 4  Anesthesia Plan: General   Post-op Pain Management:    Induction: Intravenous  PONV Risk Score and Plan: 3 and Ondansetron, Dexamethasone, Propofol infusion and Treatment may vary due to age or medical condition  Airway Management Planned: Oral ETT and Video Laryngoscope Planned  Additional Equipment: Arterial line  Intra-op Plan:   Post-operative Plan: Extubation in OR  Informed Consent: I have reviewed the patients History and Physical, chart, labs and discussed the procedure including the risks, benefits and alternatives for the proposed anesthesia with the patient or authorized representative who has indicated his/her understanding and acceptance.      Dental advisory given  Plan Discussed with: CRNA  Anesthesia Plan Comments: (Reviewed PAT note by Antionette Poles, PA-C )     Anesthesia Quick Evaluation

## 2021-02-27 NOTE — Progress Notes (Signed)
Anesthesia Chart Review: Same day workup  Patient resides in [facility].  She has been admitted there since September 2021.  Per records from their facility, history includes lupus, rheumatoid arthritis, diastolic heart failure, atrial fibrillation, GERD, HTN, lower extremity edema.  She has history of lower extremity ulcers secondary to venous stasis.  She is maintained on Eliquis for atrial fibrillation.  Per records received 02/26/2021, this is on hold for upcoming surgery.  Reportedly, there are no cardiac records on file, no echo or EKG.  Facility was unable to provide transportation for patient to be seen for preadmission testing appointment.  Evidently this surgery is urgent and nonelective due to cervical myelopathy.  She will be a same-day work-up and require day of surgery evaluation and labs.   [facility]/Anesthesiology Phone [phone] 02/27/2021 2:56 PM

## 2021-03-02 ENCOUNTER — Other Ambulatory Visit: Payer: Self-pay

## 2021-03-02 ENCOUNTER — Encounter (HOSPITAL_COMMUNITY)
Admission: RE | Disposition: A | Discharge status: Skilled Nursing Facility | Payer: Self-pay | Source: Home / Self Care | Attending: Neurosurgery

## 2021-03-02 ENCOUNTER — Encounter (HOSPITAL_COMMUNITY): Payer: Self-pay | Admitting: Neurosurgery

## 2021-03-02 ENCOUNTER — Inpatient Hospital Stay (HOSPITAL_COMMUNITY)
Admission: RE | Admit: 2021-03-02 | Discharge: 2021-03-10 | DRG: 473 | Disposition: A | Discharge status: Skilled Nursing Facility | Payer: Medicare Other | Attending: Neurosurgery | Admitting: Neurosurgery

## 2021-03-02 ENCOUNTER — Inpatient Hospital Stay (HOSPITAL_COMMUNITY): Payer: Medicare Other | Admitting: Physician Assistant

## 2021-03-02 ENCOUNTER — Inpatient Hospital Stay (HOSPITAL_COMMUNITY): Payer: Medicare Other

## 2021-03-02 DIAGNOSIS — E669 Obesity, unspecified: Secondary | ICD-10-CM | POA: Diagnosis present

## 2021-03-02 DIAGNOSIS — Z7952 Long term (current) use of systemic steroids: Secondary | ICD-10-CM | POA: Diagnosis not present

## 2021-03-02 DIAGNOSIS — M4712 Other spondylosis with myelopathy, cervical region: Principal | ICD-10-CM | POA: Diagnosis present

## 2021-03-02 DIAGNOSIS — J449 Chronic obstructive pulmonary disease, unspecified: Secondary | ICD-10-CM | POA: Diagnosis present

## 2021-03-02 DIAGNOSIS — Z90710 Acquired absence of both cervix and uterus: Secondary | ICD-10-CM | POA: Diagnosis not present

## 2021-03-02 DIAGNOSIS — I509 Heart failure, unspecified: Secondary | ICD-10-CM | POA: Diagnosis present

## 2021-03-02 DIAGNOSIS — Z20822 Contact with and (suspected) exposure to covid-19: Secondary | ICD-10-CM | POA: Diagnosis present

## 2021-03-02 DIAGNOSIS — Z91018 Allergy to other foods: Secondary | ICD-10-CM | POA: Diagnosis not present

## 2021-03-02 DIAGNOSIS — G8929 Other chronic pain: Secondary | ICD-10-CM | POA: Diagnosis present

## 2021-03-02 DIAGNOSIS — Z87891 Personal history of nicotine dependence: Secondary | ICD-10-CM

## 2021-03-02 DIAGNOSIS — Z881 Allergy status to other antibiotic agents status: Secondary | ICD-10-CM

## 2021-03-02 DIAGNOSIS — I4891 Unspecified atrial fibrillation: Secondary | ICD-10-CM | POA: Diagnosis present

## 2021-03-02 DIAGNOSIS — I110 Hypertensive heart disease with heart failure: Secondary | ICD-10-CM | POA: Diagnosis present

## 2021-03-02 DIAGNOSIS — Z96653 Presence of artificial knee joint, bilateral: Secondary | ICD-10-CM | POA: Diagnosis present

## 2021-03-02 DIAGNOSIS — M40202 Unspecified kyphosis, cervical region: Secondary | ICD-10-CM | POA: Diagnosis present

## 2021-03-02 DIAGNOSIS — Z91040 Latex allergy status: Secondary | ICD-10-CM

## 2021-03-02 DIAGNOSIS — Z79899 Other long term (current) drug therapy: Secondary | ICD-10-CM | POA: Diagnosis not present

## 2021-03-02 DIAGNOSIS — M542 Cervicalgia: Secondary | ICD-10-CM | POA: Diagnosis present

## 2021-03-02 DIAGNOSIS — M069 Rheumatoid arthritis, unspecified: Secondary | ICD-10-CM | POA: Diagnosis present

## 2021-03-02 DIAGNOSIS — M4802 Spinal stenosis, cervical region: Secondary | ICD-10-CM | POA: Diagnosis present

## 2021-03-02 DIAGNOSIS — Z419 Encounter for procedure for purposes other than remedying health state, unspecified: Secondary | ICD-10-CM

## 2021-03-02 DIAGNOSIS — Z6832 Body mass index (BMI) 32.0-32.9, adult: Secondary | ICD-10-CM | POA: Diagnosis not present

## 2021-03-02 DIAGNOSIS — G959 Disease of spinal cord, unspecified: Secondary | ICD-10-CM | POA: Diagnosis present

## 2021-03-02 DIAGNOSIS — Z7901 Long term (current) use of anticoagulants: Secondary | ICD-10-CM | POA: Diagnosis not present

## 2021-03-02 HISTORY — DX: Cardiac arrhythmia, unspecified: I49.9

## 2021-03-02 HISTORY — DX: Systemic lupus erythematosus, unspecified: M32.9

## 2021-03-02 HISTORY — DX: Reserved for concepts with insufficient information to code with codable children: IMO0002

## 2021-03-02 HISTORY — DX: Depression, unspecified: F32.A

## 2021-03-02 HISTORY — PX: ANTERIOR CERVICAL CORPECTOMY: SHX1159

## 2021-03-02 LAB — SARS CORONAVIRUS 2 BY RT PCR (HOSPITAL ORDER, PERFORMED IN ~~LOC~~ HOSPITAL LAB): SARS Coronavirus 2: NEGATIVE

## 2021-03-02 LAB — SURGICAL PCR SCREEN
MRSA, PCR: NEGATIVE
Staphylococcus aureus: NEGATIVE

## 2021-03-02 LAB — TYPE AND SCREEN
ABO/RH(D): B POS
Antibody Screen: NEGATIVE

## 2021-03-02 LAB — ABO/RH: ABO/RH(D): B POS

## 2021-03-02 SURGERY — ANTERIOR CERVICAL CORPECTOMY
Anesthesia: General

## 2021-03-02 MED ORDER — CELECOXIB 200 MG PO CAPS
200.0000 mg | ORAL_CAPSULE | Freq: Every day | ORAL | Status: DC | PRN
  Administered 2021-03-03 – 2021-03-06 (×3): 200 mg via ORAL
  Filled 2021-03-02 (×4): qty 1

## 2021-03-02 MED ORDER — PHENOL 1.4 % MT LIQD: 1.0000 | OROMUCOSAL | Status: DC | PRN

## 2021-03-02 MED ORDER — PROPOFOL 10 MG/ML IV BOLUS
INTRAVENOUS | Status: AC
  Filled 2021-03-02: qty 20

## 2021-03-02 MED ORDER — CHLORHEXIDINE GLUCONATE 0.12 % MT SOLN
15.0000 mL | Freq: Once | OROMUCOSAL | Status: AC
  Administered 2021-03-02: 15 mL via OROMUCOSAL
  Filled 2021-03-02: qty 15

## 2021-03-02 MED ORDER — FUROSEMIDE 40 MG PO TABS
40.0000 mg | ORAL_TABLET | Freq: Every day | ORAL | Status: DC
  Administered 2021-03-02 – 2021-03-09 (×8): 40 mg via ORAL
  Filled 2021-03-02 (×8): qty 1

## 2021-03-02 MED ORDER — CHLORHEXIDINE GLUCONATE CLOTH 2 % EX PADS: 6.0000 | MEDICATED_PAD | Freq: Once | CUTANEOUS | Status: DC

## 2021-03-02 MED ORDER — THROMBIN 20000 UNITS EX SOLR
CUTANEOUS | Status: DC | PRN
  Administered 2021-03-02: 20 mL via TOPICAL

## 2021-03-02 MED ORDER — OXYCODONE HCL 5 MG PO TABS
15.0000 mg | ORAL_TABLET | Freq: Four times a day (QID) | ORAL | Status: DC | PRN
  Administered 2021-03-02 – 2021-03-10 (×26): 15 mg via ORAL
  Filled 2021-03-02 (×26): qty 3

## 2021-03-02 MED ORDER — POLYVINYL ALCOHOL 1.4 % OP SOLN
1.0000 [drp] | Freq: Every day | OPHTHALMIC | Status: DC
  Administered 2021-03-03 – 2021-03-10 (×8): 1 [drp] via OPHTHALMIC
  Filled 2021-03-02: qty 15

## 2021-03-02 MED ORDER — FENTANYL CITRATE (PF) 250 MCG/5ML IJ SOLN
INTRAMUSCULAR | Status: AC
  Filled 2021-03-02: qty 5

## 2021-03-02 MED ORDER — PANTOPRAZOLE SODIUM 40 MG IV SOLR: 40.0000 mg | Freq: Every day | INTRAVENOUS | Status: DC

## 2021-03-02 MED ORDER — ACETAMINOPHEN 650 MG RE SUPP: 650.0000 mg | RECTAL | Status: DC | PRN

## 2021-03-02 MED ORDER — HYDROXYCHLOROQUINE SULFATE 200 MG PO TABS
200.0000 mg | ORAL_TABLET | ORAL | Status: DC
  Administered 2021-03-02 – 2021-03-10 (×16): 200 mg via ORAL
  Filled 2021-03-02 (×19): qty 1

## 2021-03-02 MED ORDER — FENTANYL CITRATE (PF) 250 MCG/5ML IJ SOLN
INTRAMUSCULAR | Status: DC | PRN
  Administered 2021-03-02: 50 ug via INTRAVENOUS
  Administered 2021-03-02: 100 ug via INTRAVENOUS
  Administered 2021-03-02 (×5): 50 ug via INTRAVENOUS

## 2021-03-02 MED ORDER — ACETAMINOPHEN 500 MG PO TABS
ORAL_TABLET | ORAL | Status: AC
  Administered 2021-03-02: 1000 mg via ORAL
  Filled 2021-03-02: qty 2

## 2021-03-02 MED ORDER — THROMBIN 5000 UNITS EX SOLR
OROMUCOSAL | Status: DC | PRN
  Administered 2021-03-02 (×2): 5 mL via TOPICAL

## 2021-03-02 MED ORDER — AMISULPRIDE (ANTIEMETIC) 5 MG/2ML IV SOLN: 10.0000 mg | Freq: Once | INTRAVENOUS | Status: DC | PRN

## 2021-03-02 MED ORDER — ONDANSETRON HCL 4 MG/2ML IJ SOLN: 4.0000 mg | Freq: Once | INTRAMUSCULAR | Status: DC | PRN

## 2021-03-02 MED ORDER — HYDROMORPHONE HCL 1 MG/ML IJ SOLN: 0.5000 mg | INTRAMUSCULAR | Status: DC | PRN

## 2021-03-02 MED ORDER — METOLAZONE 5 MG PO TABS
5.0000 mg | ORAL_TABLET | Freq: Every day | ORAL | Status: DC | PRN
  Filled 2021-03-02: qty 1

## 2021-03-02 MED ORDER — 0.9 % SODIUM CHLORIDE (POUR BTL) OPTIME
TOPICAL | Status: DC | PRN
  Administered 2021-03-02: 1000 mL

## 2021-03-02 MED ORDER — POLYVINYL ALCOHOL 1.4 % OP SOLN
1.0000 [drp] | Freq: Two times a day (BID) | OPHTHALMIC | Status: DC | PRN
  Administered 2021-03-06: 1 [drp] via OPHTHALMIC

## 2021-03-02 MED ORDER — PROPOFOL 10 MG/ML IV BOLUS
INTRAVENOUS | Status: DC | PRN
  Administered 2021-03-02: 100 mg via INTRAVENOUS
  Administered 2021-03-02: 50 mg via INTRAVENOUS

## 2021-03-02 MED ORDER — THROMBIN 5000 UNITS EX SOLR
CUTANEOUS | Status: AC
  Filled 2021-03-02: qty 5000

## 2021-03-02 MED ORDER — ALBUTEROL SULFATE (2.5 MG/3ML) 0.083% IN NEBU: 3.0000 mL | INHALATION_SOLUTION | RESPIRATORY_TRACT | Status: DC | PRN

## 2021-03-02 MED ORDER — ONDANSETRON HCL 4 MG PO TABS: 4.0000 mg | ORAL_TABLET | Freq: Four times a day (QID) | ORAL | Status: DC | PRN

## 2021-03-02 MED ORDER — FENTANYL CITRATE (PF) 100 MCG/2ML IJ SOLN
25.0000 ug | INTRAMUSCULAR | Status: DC | PRN
  Administered 2021-03-02: 50 ug via INTRAVENOUS

## 2021-03-02 MED ORDER — LIDOCAINE-EPINEPHRINE 1 %-1:100000 IJ SOLN
INTRAMUSCULAR | Status: AC
  Filled 2021-03-02: qty 1

## 2021-03-02 MED ORDER — METHOCARBAMOL 1000 MG/10ML IJ SOLN
500.0000 mg | Freq: Four times a day (QID) | INTRAVENOUS | Status: DC | PRN
  Filled 2021-03-02: qty 5

## 2021-03-02 MED ORDER — LIDOCAINE 2% (20 MG/ML) 5 ML SYRINGE
INTRAMUSCULAR | Status: AC
  Filled 2021-03-02: qty 5

## 2021-03-02 MED ORDER — DEXAMETHASONE SODIUM PHOSPHATE 10 MG/ML IJ SOLN: 10.0000 mg | Freq: Once | INTRAMUSCULAR | Status: DC

## 2021-03-02 MED ORDER — CEFAZOLIN SODIUM-DEXTROSE 2-4 GM/100ML-% IV SOLN
2.0000 g | INTRAVENOUS | Status: AC
  Administered 2021-03-02: 2 g via INTRAVENOUS
  Filled 2021-03-02: qty 100

## 2021-03-02 MED ORDER — ORAL CARE MOUTH RINSE: 15.0000 mL | Freq: Once | OROMUCOSAL | Status: AC

## 2021-03-02 MED ORDER — PANTOPRAZOLE SODIUM 40 MG PO TBEC
40.0000 mg | DELAYED_RELEASE_TABLET | Freq: Every day | ORAL | Status: DC
  Administered 2021-03-02 – 2021-03-10 (×9): 40 mg via ORAL
  Filled 2021-03-02 (×9): qty 1

## 2021-03-02 MED ORDER — LACTATED RINGERS IV SOLN: INTRAVENOUS | Status: DC

## 2021-03-02 MED ORDER — METOPROLOL TARTRATE 25 MG PO TABS
25.0000 mg | ORAL_TABLET | Freq: Two times a day (BID) | ORAL | Status: DC
  Administered 2021-03-02 – 2021-03-10 (×14): 25 mg via ORAL
  Filled 2021-03-02 (×16): qty 1

## 2021-03-02 MED ORDER — ALUM & MAG HYDROXIDE-SIMETH 200-200-20 MG/5ML PO SUSP
30.0000 mL | Freq: Four times a day (QID) | ORAL | Status: DC | PRN
Start: 2021-03-02 — End: 2021-03-10

## 2021-03-02 MED ORDER — MIDAZOLAM HCL 2 MG/2ML IJ SOLN
INTRAMUSCULAR | Status: AC
  Filled 2021-03-02: qty 2

## 2021-03-02 MED ORDER — ROCURONIUM BROMIDE 10 MG/ML (PF) SYRINGE
PREFILLED_SYRINGE | INTRAVENOUS | Status: DC | PRN
  Administered 2021-03-02: 20 mg via INTRAVENOUS
  Administered 2021-03-02: 10 mg via INTRAVENOUS
  Administered 2021-03-02: 60 mg via INTRAVENOUS
  Administered 2021-03-02: 10 mg via INTRAVENOUS

## 2021-03-02 MED ORDER — FENTANYL CITRATE (PF) 100 MCG/2ML IJ SOLN
INTRAMUSCULAR | Status: AC
  Filled 2021-03-02: qty 2

## 2021-03-02 MED ORDER — METHIMAZOLE 5 MG PO TABS
7.5000 mg | ORAL_TABLET | Freq: Every day | ORAL | Status: DC
  Administered 2021-03-03 – 2021-03-10 (×7): 7.5 mg via ORAL
  Filled 2021-03-02 (×8): qty 2

## 2021-03-02 MED ORDER — SODIUM CHLORIDE 0.9% FLUSH: 3.0000 mL | INTRAVENOUS | Status: DC | PRN

## 2021-03-02 MED ORDER — BUPIVACAINE HCL (PF) 0.25 % IJ SOLN
INTRAMUSCULAR | Status: AC
  Filled 2021-03-02: qty 30

## 2021-03-02 MED ORDER — PREDNISONE 5 MG PO TABS
10.0000 mg | ORAL_TABLET | Freq: Every day | ORAL | Status: DC
  Administered 2021-03-03 – 2021-03-10 (×8): 10 mg via ORAL
  Filled 2021-03-02: qty 1
  Filled 2021-03-02 (×2): qty 2
  Filled 2021-03-02: qty 1
  Filled 2021-03-02 (×5): qty 2

## 2021-03-02 MED ORDER — CEFAZOLIN SODIUM-DEXTROSE 2-4 GM/100ML-% IV SOLN
2.0000 g | Freq: Three times a day (TID) | INTRAVENOUS | Status: AC
Start: 2021-03-02 — End: 2021-03-03
  Administered 2021-03-02 (×2): 2 g via INTRAVENOUS
  Filled 2021-03-02 (×2): qty 100

## 2021-03-02 MED ORDER — DIGOXIN 125 MCG PO TABS
187.5000 ug | ORAL_TABLET | Freq: Every day | ORAL | Status: DC
  Administered 2021-03-03 – 2021-03-09 (×6): 187.5 ug via ORAL
  Filled 2021-03-02: qty 1
  Filled 2021-03-02 (×6): qty 2

## 2021-03-02 MED ORDER — ONDANSETRON HCL 4 MG/2ML IJ SOLN: 4.0000 mg | Freq: Four times a day (QID) | INTRAMUSCULAR | Status: DC | PRN

## 2021-03-02 MED ORDER — BACITRACIN ZINC 500 UNIT/GM EX OINT
TOPICAL_OINTMENT | CUTANEOUS | Status: AC
  Filled 2021-03-02: qty 28.35

## 2021-03-02 MED ORDER — LIDOCAINE 2% (20 MG/ML) 5 ML SYRINGE
INTRAMUSCULAR | Status: DC | PRN
  Administered 2021-03-02: 40 mg via INTRAVENOUS

## 2021-03-02 MED ORDER — ACETAMINOPHEN 325 MG PO TABS
650.0000 mg | ORAL_TABLET | ORAL | Status: DC | PRN
  Administered 2021-03-07: 650 mg via ORAL
  Filled 2021-03-02 (×2): qty 2

## 2021-03-02 MED ORDER — CHLORHEXIDINE GLUCONATE CLOTH 2 % EX PADS
6.0000 | MEDICATED_PAD | Freq: Every day | CUTANEOUS | Status: DC
  Administered 2021-03-03: 6 via TOPICAL

## 2021-03-02 MED ORDER — PHENYLEPHRINE 40 MCG/ML (10ML) SYRINGE FOR IV PUSH (FOR BLOOD PRESSURE SUPPORT)
PREFILLED_SYRINGE | INTRAVENOUS | Status: DC | PRN
  Administered 2021-03-02: 80 ug via INTRAVENOUS

## 2021-03-02 MED ORDER — SODIUM CHLORIDE 0.9 % IV SOLN: 250.0000 mL | INTRAVENOUS | Status: DC

## 2021-03-02 MED ORDER — ACETAMINOPHEN 500 MG PO TABS: 1000.0000 mg | ORAL_TABLET | Freq: Once | ORAL | Status: AC

## 2021-03-02 MED ORDER — LACTATED RINGERS IV SOLN: INTRAVENOUS | Status: DC | PRN

## 2021-03-02 MED ORDER — DEXAMETHASONE SODIUM PHOSPHATE 10 MG/ML IJ SOLN
INTRAMUSCULAR | Status: AC
  Filled 2021-03-02: qty 1

## 2021-03-02 MED ORDER — TALC EX POWD: Freq: Two times a day (BID) | CUTANEOUS | Status: DC | PRN

## 2021-03-02 MED ORDER — PHENYLEPHRINE 40 MCG/ML (10ML) SYRINGE FOR IV PUSH (FOR BLOOD PRESSURE SUPPORT)
PREFILLED_SYRINGE | INTRAVENOUS | Status: AC
  Filled 2021-03-02: qty 10

## 2021-03-02 MED ORDER — METHOCARBAMOL 500 MG PO TABS
500.0000 mg | ORAL_TABLET | Freq: Four times a day (QID) | ORAL | Status: DC | PRN
  Administered 2021-03-02 – 2021-03-10 (×17): 500 mg via ORAL
  Filled 2021-03-02 (×18): qty 1

## 2021-03-02 MED ORDER — SUGAMMADEX SODIUM 200 MG/2ML IV SOLN
INTRAVENOUS | Status: DC | PRN
Start: 2021-03-02 — End: 2021-03-02
  Administered 2021-03-02: 200 mg via INTRAVENOUS

## 2021-03-02 MED ORDER — EPHEDRINE SULFATE-NACL 50-0.9 MG/10ML-% IV SOSY
PREFILLED_SYRINGE | INTRAVENOUS | Status: DC | PRN
Start: 2021-03-02 — End: 2021-03-02
  Administered 2021-03-02: 5 mg via INTRAVENOUS
  Administered 2021-03-02: 10 mg via INTRAVENOUS
  Administered 2021-03-02: 5 mg via INTRAVENOUS

## 2021-03-02 MED ORDER — THROMBIN 20000 UNITS EX SOLR
CUTANEOUS | Status: AC
  Filled 2021-03-02: qty 20000

## 2021-03-02 MED ORDER — ONDANSETRON HCL 4 MG/2ML IJ SOLN
INTRAMUSCULAR | Status: AC
  Filled 2021-03-02: qty 2

## 2021-03-02 MED ORDER — EPHEDRINE 5 MG/ML INJ
INTRAVENOUS | Status: AC
  Filled 2021-03-02: qty 5

## 2021-03-02 MED ORDER — MENTHOL 3 MG MT LOZG: 1.0000 | LOZENGE | OROMUCOSAL | Status: DC | PRN

## 2021-03-02 MED ORDER — SODIUM CHLORIDE 0.9% FLUSH
3.0000 mL | Freq: Two times a day (BID) | INTRAVENOUS | Status: DC
  Administered 2021-03-02 – 2021-03-09 (×9): 3 mL via INTRAVENOUS

## 2021-03-02 MED ORDER — ONDANSETRON HCL 4 MG/2ML IJ SOLN
INTRAMUSCULAR | Status: DC | PRN
  Administered 2021-03-02: 4 mg via INTRAVENOUS

## 2021-03-02 MED ORDER — DILTIAZEM HCL ER COATED BEADS 120 MG PO CP24
240.0000 mg | ORAL_CAPSULE | Freq: Every day | ORAL | Status: DC
  Administered 2021-03-03 – 2021-03-10 (×6): 240 mg via ORAL
  Filled 2021-03-02 (×3): qty 2
  Filled 2021-03-02: qty 1
  Filled 2021-03-02 (×5): qty 2

## 2021-03-02 MED ORDER — ROCURONIUM BROMIDE 10 MG/ML (PF) SYRINGE
PREFILLED_SYRINGE | INTRAVENOUS | Status: AC
  Filled 2021-03-02: qty 10

## 2021-03-02 MED ORDER — DEXAMETHASONE SODIUM PHOSPHATE 10 MG/ML IJ SOLN
INTRAMUSCULAR | Status: DC | PRN
  Administered 2021-03-02: 10 mg via INTRAVENOUS

## 2021-03-02 SURGICAL SUPPLY — 76 items
BAG COUNTER SPONGE SURGICOUNT (BAG) ×4 IMPLANT
BAND RUBBER #18 3X1/16 STRL (MISCELLANEOUS) ×4 IMPLANT
BASKET BONE COLLECTION (BASKET) ×2 IMPLANT
BENZOIN TINCTURE PRP APPL 2/3 (GAUZE/BANDAGES/DRESSINGS) ×2 IMPLANT
BIT DRILL NEURO 2X3.1 SFT TUCH (MISCELLANEOUS) ×1 IMPLANT
BLADE CLIPPER SURG (BLADE) IMPLANT
BLADE SURG 11 STRL SS (BLADE) ×2 IMPLANT
BONE VIVIGEN FORMABLE 1.3CC (Bone Implant) ×4 IMPLANT
BUR MATCHSTICK NEURO 3.0 LAGG (BURR) ×4 IMPLANT
CANISTER SUCT 3000ML PPV (MISCELLANEOUS) ×2 IMPLANT
CARTRIDGE OIL MAESTRO DRILL (MISCELLANEOUS) ×1 IMPLANT
CLSR STERI-STRIP ANTIMIC 1/2X4 (GAUZE/BANDAGES/DRESSINGS) ×2 IMPLANT
DECANTER SPIKE VIAL GLASS SM (MISCELLANEOUS) IMPLANT
DERMABOND ADVANCED (GAUZE/BANDAGES/DRESSINGS) ×1
DERMABOND ADVANCED .7 DNX12 (GAUZE/BANDAGES/DRESSINGS) ×1 IMPLANT
DIFFUSER DRILL AIR PNEUMATIC (MISCELLANEOUS) ×2 IMPLANT
DRAIN JP 7F FLT 3/4 PRF SI HBL (DRAIN) ×2 IMPLANT
DRAPE C-ARM 42X72 X-RAY (DRAPES) ×4 IMPLANT
DRAPE LAPAROTOMY 100X72 PEDS (DRAPES) ×2 IMPLANT
DRAPE MICROSCOPE LEICA (MISCELLANEOUS) ×2 IMPLANT
DRAPE SURG 17X23 STRL (DRAPES) ×4 IMPLANT
DRILL NEURO 2X3.1 SOFT TOUCH (MISCELLANEOUS) ×2
DRSG OPSITE 4X5.5 SM (GAUZE/BANDAGES/DRESSINGS) ×2 IMPLANT
DRSG OPSITE POSTOP 4X6 (GAUZE/BANDAGES/DRESSINGS) ×2 IMPLANT
DURAPREP 26ML APPLICATOR (WOUND CARE) IMPLANT
DURAPREP 6ML APPLICATOR 50/CS (WOUND CARE) ×2 IMPLANT
ELECT COATED BLADE 2.86 ST (ELECTRODE) ×2 IMPLANT
ELECT REM PT RETURN 9FT ADLT (ELECTROSURGICAL) ×2
ELECTRODE REM PT RTRN 9FT ADLT (ELECTROSURGICAL) ×1 IMPLANT
EVACUATOR SILICONE 100CC (DRAIN) ×2 IMPLANT
GAUZE 4X4 16PLY ~~LOC~~+RFID DBL (SPONGE) IMPLANT
GAUZE SPONGE 4X4 12PLY STRL (GAUZE/BANDAGES/DRESSINGS) ×2 IMPLANT
GLOVE EXAM NITRILE XL STR (GLOVE) IMPLANT
GLOVE SURG ENC MOIS LTX SZ7 (GLOVE) IMPLANT
GLOVE SURG ENC MOIS LTX SZ8 (GLOVE) ×4 IMPLANT
GLOVE SURG UNDER LTX SZ8.5 (GLOVE) ×4 IMPLANT
GLOVE SURG UNDER POLY LF SZ7 (GLOVE) IMPLANT
GOWN STRL REUS W/ TWL LRG LVL3 (GOWN DISPOSABLE) IMPLANT
GOWN STRL REUS W/ TWL XL LVL3 (GOWN DISPOSABLE) ×2 IMPLANT
GOWN STRL REUS W/TWL 2XL LVL3 (GOWN DISPOSABLE) IMPLANT
GOWN STRL REUS W/TWL LRG LVL3 (GOWN DISPOSABLE)
GOWN STRL REUS W/TWL XL LVL3 (GOWN DISPOSABLE) ×2
HALTER HD/CHIN CERV TRACTION D (MISCELLANEOUS) ×2 IMPLANT
HEMOSTAT POWDER KIT SURGIFOAM (HEMOSTASIS) ×2 IMPLANT
KIT BASIN OR (CUSTOM PROCEDURE TRAY) ×2 IMPLANT
KIT TURNOVER KIT B (KITS) ×2 IMPLANT
MARKER SKIN DUAL TIP RULER LAB (MISCELLANEOUS) ×2 IMPLANT
NEEDLE HYPO 25X1 1.5 SAFETY (NEEDLE) ×2 IMPLANT
NEEDLE SPNL 20GX3.5 QUINCKE YW (NEEDLE) ×2 IMPLANT
NS IRRIG 1000ML POUR BTL (IV SOLUTION) ×2 IMPLANT
OIL CARTRIDGE MAESTRO DRILL (MISCELLANEOUS) ×2
PACK LAMINECTOMY NEURO (CUSTOM PROCEDURE TRAY) ×2 IMPLANT
PAD ARMBOARD 7.5X6 YLW CONV (MISCELLANEOUS) ×8 IMPLANT
PIN DISTRACTION 14MM (PIN) ×4 IMPLANT
PIN MAYFIELD SKULL DISP (PIN) IMPLANT
PLATE ANT CERV XTEND 1 LV 20 (Plate) ×2 IMPLANT
SCREW VAR 4.2 XD SELF DRILL 14 (Screw) IMPLANT
SCREW XTD VAR 4.2 SELF TAP (Screw) ×6 IMPLANT
SCREW XTEND SELFTAP VAR 4.6X14 (Screw) ×2 IMPLANT
SPACER CORP NIKO 12X14X15 0D (Spacer) ×2 IMPLANT
SPONGE INTESTINAL PEANUT (DISPOSABLE) ×2 IMPLANT
SPONGE SURGIFOAM ABS GEL 100 (HEMOSTASIS) ×2 IMPLANT
SPONGE T-LAP 4X18 ~~LOC~~+RFID (SPONGE) IMPLANT
STRIP CLOSURE SKIN 1/2X4 (GAUZE/BANDAGES/DRESSINGS) ×4 IMPLANT
SUT ETHILON 4 0 PS 2 18 (SUTURE) IMPLANT
SUT VIC AB 0 CT1 18XCR BRD8 (SUTURE) IMPLANT
SUT VIC AB 0 CT1 8-18 (SUTURE)
SUT VIC AB 2-0 CT1 18 (SUTURE) IMPLANT
SUT VIC AB 3-0 SH 8-18 (SUTURE) ×2 IMPLANT
SUT VIC AB 4-0 PS2 27 (SUTURE) ×2 IMPLANT
SUT VICRYL 4-0 PS2 18IN ABS (SUTURE) ×2 IMPLANT
TAPE CLOTH 4X10 WHT NS (GAUZE/BANDAGES/DRESSINGS) ×2 IMPLANT
TOWEL GREEN STERILE (TOWEL DISPOSABLE) ×4 IMPLANT
TOWEL GREEN STERILE FF (TOWEL DISPOSABLE) ×4 IMPLANT
TRAY FOLEY MTR SLVR 16FR STAT (SET/KITS/TRAYS/PACK) IMPLANT
WATER STERILE IRR 1000ML POUR (IV SOLUTION) ×2 IMPLANT

## 2021-03-02 NOTE — Anesthesia Procedure Notes (Signed)
Arterial Line Insertion Start/End8/22/2022 8:15 AM, 03/02/2021 8:20 AM Performed by: Leonides Grills, MD, anesthesiologist  Patient location: OR. Preanesthetic checklist: patient identified, IV checked, site marked, risks and benefits discussed, surgical consent, monitors and equipment checked, pre-op evaluation, timeout performed and anesthesia consent Lidocaine 1% used for infiltration Right, radial was placed Catheter size: 20 G Hand hygiene performed , maximum sterile barriers used  and Seldinger technique used  Attempts: 1 Procedure performed using ultrasound guided technique. Ultrasound Notes:anatomy identified, needle tip was noted to be adjacent to the nerve/plexus identified and no ultrasound evidence of intravascular and/or intraneural injection Following insertion, dressing applied and Biopatch. Post procedure assessment: normal and unchanged  Patient tolerated the procedure well with no immediate complications.

## 2021-03-02 NOTE — Op Note (Signed)
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  Exploration fusion move of old hardware C5-C7 with removal of Codman plate  Surgeon: Jillyn Hidden Wayne Wicklund  Assistant: Julien Girt  Anesthesia: General  EBL: Minimal  HPI: Patient is a very pleasant 80 year old female whose had progressive myelopathy over the last few years has had multiple complications delaying her surgery to decompress her spinal cord and presents with C4 vertebral body retrolisthesis against her spinal cord with kyphosis at the C3-4 disc base and ankylosis.  Due to patient's progressive myelopathy imaging findings and failed conservative treatment I recommended anterior cervical corpectomy with posterior cervical augmentation.  However in the holding area before surgery patient really did not feel comfortable proceeding forward with the posterior aspect of the operation she had forgotten and did not realize that these 2 procedures were being performed on the same day and asked me to defer the posterior operation for another day.  I extensively explained to her the risks and benefits of both procedures and that I was uncomfortable with the stability of the anterior construct alone secondary to her severe osteopenia and longstanding kyphosis and compression with progression on imaging findings she understands that however she again reiterated that she did not want to go through both stages on the same day.  So I did counsel her to proceed forward with anterior stage and that we would based on what we found intraoperatively consider putting her back on the schedule later in the week for the posterior stage of the operation and she  agreed.  So we went over the risks and benefits perioperative course expectations of outcome and alternatives and she understood and agreed to proceed forward with a corpectomy piece of the two-stage procedure.  Operative procedure: Patient was brought into the OR was Duson general anesthesia positioned supine with the head essentially neutral position as she had very minimal range of motion of her neck and very minimal ability to extend.  The right side of her neck was shaved prepped and draped in routine sterile fashion preoperative x-ray localized the appropriate level so a curvilinear incision was made just off the midline to the entry border of the sternocleidomastoid and the superficial of systems dissected out divided longitudinally.  The avascular plane between the sternomastoid and strap muscles was developed down to the prevertebral fascia and pretty fascia was dissected away with Kitners.  I immediately identified the old plate and dissected that free then and I identified disc bases above it in the C3 vertebral body up to the superior aspect of the C3 endplate and C2-3 disc base.  Lungs goes reflected laterally intraoperative x-ray confirmed identification appropriate level I then replaced the self-retaining retractor and proceeded to remove the cognitive plate that fusion did appear to be solid then incised the disc base at the immediate superior aspect of the old plate site and then drilled that disc base down and then working from the 4 5 disc through the foreign body came up into the 3 4 disc 3 4 disc was somewhat ankylosed so I was careful to preserve endplate to the best that I can appreciate and captured all of the bone and all the pieces of the C4 vertebral body to save later for the packing the  cage.  Then identified the lateral margins at the disc base level and marching up laterally remove the C4 vertebral body was an extensive mount of compression at the level of the 3 4 disc base this was  all teased off of thecal sac and it significantly decompressed the central part of the canal and spinal cord aggressive under biting of both the C3 endplate and the C5 endplate and continued to decompress the central canal.  I then sized up a 57mm Nico peek cage packed with locally harvested autograft mixed with beverage and mix and utilizing distractor pins and gentle inline traction placed the cage under fluoroscopy.  After adequate positioning of been confirmed I selected a 20 mm globus extend plate and utilized one of the previous holes drilled a new hole and placed 2 new holes in C3 actually all screws with excellent purchase.  Locking mechanisms were engaged the wounds were copiously irrigated meticulous hemostasis was maintained I did place a JP drain and closed the wound in layers with active Vicryl and a running 4 subcuticular at the end the case all counts and sponge counts were correct Dermabond benzoin Steri-Strips were applied.

## 2021-03-02 NOTE — Progress Notes (Signed)
COVID Swab walked down to lab @ 05:55.

## 2021-03-02 NOTE — Anesthesia Procedure Notes (Signed)
Procedure Name: Intubation Date/Time: 03/02/2021 8:10 AM Performed by: Lovie Chol, CRNA Pre-anesthesia Checklist: Patient identified, Emergency Drugs available, Suction available and Patient being monitored Patient Re-evaluated:Patient Re-evaluated prior to induction Oxygen Delivery Method: Circle System Utilized Preoxygenation: Pre-oxygenation with 100% oxygen Induction Type: IV induction Ventilation: Mask ventilation without difficulty Laryngoscope Size: Glidescope and 4 Grade View: Grade I Tube type: Oral Tube size: 7.0 mm Number of attempts: 1 Airway Equipment and Method: Stylet and Oral airway Placement Confirmation: ETT inserted through vocal cords under direct vision, positive ETCO2 and breath sounds checked- equal and bilateral Secured at: 20 cm Tube secured with: Tape Dental Injury: Teeth and Oropharynx as per pre-operative assessment

## 2021-03-02 NOTE — Progress Notes (Signed)
Pt brought labs (CBCw/ Diff & CMP) from facility obtained 02/27/21. Labs placed in physical chart. Dr. Bradley Ferris aware.

## 2021-03-02 NOTE — Progress Notes (Signed)
Orthopedic Tech Progress Note Patient Details:  Shirley Waters Apr 26, 1941 [id number]  Dropped off an ASPEN COLLAR to PACU per PACU RN   Patient ID: Shirley Waters, female   DOB: 1941-01-08, 80 y.o.   MRN: [id number]  [name] 03/02/2021, 12:00 PM

## 2021-03-02 NOTE — Transfer of Care (Signed)
Immediate Anesthesia Transfer of Care Note  Patient: Shirley Waters  Procedure(s) Performed: Corpectomy - Cervical four with anterior cervical plating and removal of hardware Cervical five-Cervical seven anterior  Patient Location: PACU  Anesthesia Type:General  Level of Consciousness: awake, oriented and patient cooperative  Airway & Oxygen Therapy: Patient Spontanous Breathing and Patient connected to nasal cannula oxygen  Post-op Assessment: Report given to RN and Post -op Vital signs reviewed and stable  Post vital signs: Reviewed  Last Vitals:  Vitals Value Taken Time  BP 108/52 03/02/21 1153  Temp    Pulse 62 03/02/21 1200  Resp 23 03/02/21 1200  SpO2 99 % 03/02/21 1200  Vitals shown include unvalidated device data.  Last Pain:  Vitals:   03/02/21 0633  TempSrc:   PainSc: 5       Patients Stated Pain Goal: 3 (03/02/21 4196)  Complications: No notable events documented.

## 2021-03-03 ENCOUNTER — Inpatient Hospital Stay (HOSPITAL_COMMUNITY): Payer: Medicare Other

## 2021-03-03 MED ORDER — CHLORHEXIDINE GLUCONATE CLOTH 2 % EX PADS
6.0000 | MEDICATED_PAD | Freq: Every day | CUTANEOUS | Status: DC
  Administered 2021-03-03 – 2021-03-10 (×8): 6 via TOPICAL

## 2021-03-03 NOTE — Progress Notes (Signed)
NURSING PROGRESS NOTE Report called to [facility] receiving RN on 4N. Patient was transported without any issues. All of her items were transported with her to her new room, 4N07.

## 2021-03-03 NOTE — Anesthesia Postprocedure Evaluation (Signed)
Anesthesia Post Note  Patient: Shirley Waters  Procedure(s) Performed: Corpectomy - Cervical four with anterior cervical plating and removal of hardware Cervical five-Cervical seven anterior     Patient location during evaluation: PACU Anesthesia Type: General Level of consciousness: awake Pain management: pain level controlled Vital Signs Assessment: post-procedure vital signs reviewed and stable Respiratory status: spontaneous breathing, nonlabored ventilation, respiratory function stable and patient connected to nasal cannula oxygen Cardiovascular status: blood pressure returned to baseline and stable Postop Assessment: no apparent nausea or vomiting Anesthetic complications: no   No notable events documented.  Last Vitals:  Vitals:   03/02/21 2350 03/03/21 0320  BP: 123/60 (!) 119/54  Pulse: 68 70  Resp: 19 17  Temp: 36.8 C 37 C  SpO2: 98% 99%    Last Pain:  Vitals:   03/03/21 0415  TempSrc:   PainSc: Asleep                 Niccolo Burggraf P [name]

## 2021-03-03 NOTE — Progress Notes (Signed)
Subjective: Patient reports  pain managed numbness might be slightly improved in her hands but no significant change yet patient is feeding herself collar is in good position temporarily however still working on finding a better collar to fit her  Objective: Vital signs in last 24 hours: Temp:  [97.6 F (36.4 C)-98.6 F (37 C)] 98.6 F (37 C) (08/23 0320) Pulse Rate:  [54-70] 70 (08/23 0320) Resp:  [13-20] 17 (08/23 0320) BP: (107-127)/(51-60) 119/54 (08/23 0320) SpO2:  [96 %-100 %] 99 % (08/23 0320)  Intake/Output from previous day: 08/22 0701 - 08/23 0700 In: 2680 [P.O.:580; I.V.:1800; IV Piggyback:300] Out: 3520 [Urine:3300; Drains:70; Blood:150] Intake/Output this shift: No intake/output data recorded.  Exam stable with cervical myelopathy  Lab Results: No results for input(s): WBC, HGB, HCT, PLT in the last 72 hours. BMET No results for input(s): NA, K, CL, CO2, GLUCOSE, BUN, CREATININE, CALCIUM in the last 72 hours.  Studies/Results: DG Cervical Spine 2 or 3 views  Result Date: 03/02/2021 CLINICAL DATA:  surgery elective. EXAM: DG C-ARM 1-60 MIN; CERVICAL SPINE - 2-3 VIEW FLUOROSCOPY TIME:  Fluoroscopy Time:  30 seconds Radiation Exposure Index (if provided by the fluoroscopic device): 7.17 mGy Number of Acquired Spot Images: 6 COMPARISON:  MRI Nov 14, 2020. FINDINGS: Six total C-arm fluoroscopic images were obtained intraoperatively and submitted for post operative interpretation. Initial images demonstrate localization at the C3-C4 level with anterior approach needle with partially imaged prior ACDF seen at C5-C7. Final image demonstrates removal of the previously seen C5-C7 ACDF and placement of a new anterior plate and screw fixation spanning from C3 to C5 with C4 corpectomy. Please see the performing provider's procedural report for further detail. IMPRESSION: Intraoperative fluoroscopy, as detailed above. Electronically Signed   By: Feliberto Harts M.D.   On: 03/02/2021  12:27   DG C-Arm 1-60 Min  Result Date: 03/02/2021 CLINICAL DATA:  surgery elective. EXAM: DG C-ARM 1-60 MIN; CERVICAL SPINE - 2-3 VIEW FLUOROSCOPY TIME:  Fluoroscopy Time:  30 seconds Radiation Exposure Index (if provided by the fluoroscopic device): 7.17 mGy Number of Acquired Spot Images: 6 COMPARISON:  MRI Nov 14, 2020. FINDINGS: Six total C-arm fluoroscopic images were obtained intraoperatively and submitted for post operative interpretation. Initial images demonstrate localization at the C3-C4 level with anterior approach needle with partially imaged prior ACDF seen at C5-C7. Final image demonstrates removal of the previously seen C5-C7 ACDF and placement of a new anterior plate and screw fixation spanning from C3 to C5 with C4 corpectomy. Please see the performing provider's procedural report for further detail. IMPRESSION: Intraoperative fluoroscopy, as detailed above. Electronically Signed   By: Feliberto Harts M.D.   On: 03/02/2021 12:27    Assessment/Plan: Postop day 1 anterior cervical corpectomy and fusion patient overall doing well neurologically stable we will plan CT scan of her neck today and timing of posterior augmentation  LOS: 1 day     Mariam Dollar 03/03/2021, 7:49 AM

## 2021-03-03 NOTE — Evaluation (Addendum)
Occupational Therapy Evaluation Patient Details Name: Shirley Waters MRN: [id number] DOB: March 10, 1941 Today's Date: 03/03/2021    History of Present Illness 80 year old female with s/p Anterior cervical corpectomy of C4, anterior cervical plating from C3-C5, and fusion of C5-C7. PMH including a-fib, CHF, thyroid disorder, DVT (RLE), HTN, Lupus, RA, bilateral knee replacement (1971, 1972), BLE wound debridement 2019.   Clinical Impression    PTA, pt was living at [facility] and was performing LB ADLs with Min A and stand pivot to/from w/c with supervision from staff. Pt currently requiring Min A for grooming/feeding, Max A for bathing/dressing, and Min A +2 for functional transfers. Providing education on cervical precautions and bed mobility techniques. Pt presenting with decreased strength, balance, ROM, and activity tolerance. Pt highly motivated to return to PLOF and more independence. Pt tearful during session when discussing discharge, precautions, and currently level of function. Pt would benefit from further acute OT to facilitate safe dc. Recommend dc to SNF rehab for further OT to optimize safety, independence with ADLs, and return to PLOF.    Follow Up Recommendations  SNF    Equipment Recommendations  None recommended by OT    Recommendations for Other Services PT consult     Precautions / Restrictions Precautions Precautions: Cervical;Fall Precaution Booklet Issued: Yes (comment) Precaution Comments: Reviewed cervical precautions Required Braces or Orthoses: Cervical Brace Cervical Brace: Hard collar;At all times (Do no remove hard collar; apply in supine) Restrictions Weight Bearing Restrictions: No      Mobility Bed Mobility Overal bed mobility: Needs Assistance Bed Mobility: Supine to Sit     Supine to sit: Min assist;HOB elevated     General bed mobility comments: Pt educated on log rolling technique, pt verbalized concern stating "I wont be able to do this independently  and i will have to wait for someone." raising HOB to 90 degrees and keeping spine neutral while scooting legs of EOB    Transfers Overall transfer level: Needs assistance Equipment used: None Transfers: Stand Pivot Transfers   Stand pivot transfers: Min assist;+2 physical assistance       General transfer comment: Pt completed stand pivot transfer to manual w/c with min a for balance during transfer    Balance Overall balance assessment: Needs assistance Sitting-balance support: No upper extremity supported;Feet supported Sitting balance-Leahy Scale: Fair     Standing balance support: Bilateral upper extremity supported;During functional activity Standing balance-Leahy Scale: Poor                             ADL either performed or assessed with clinical judgement   ADL Overall ADL's : Needs assistance/impaired Eating/Feeding: Minimal assistance;Sitting Eating/Feeding Details (indicate cue type and reason): Pt required assistance with opening containers. Grooming: Minimal assistance;Sitting   Upper Body Bathing: Maximal assistance;Sitting   Lower Body Bathing: Maximal assistance;Sit to/from stand   Upper Body Dressing : Maximal assistance;Sitting   Lower Body Dressing: Maximal assistance;Sit to/from stand   Toilet Transfer: Min guard;+2 for safety/equipment;Stand-pivot;Cueing for Chief of Staff Details (indicate cue type and reason): Pt completed transfer to manual w/c and required min assistance and cues to adhere to spinal precautions         Functional mobility during ADLs: Minimal assistance;+2 for physical assistance (stand-pivot)       Vision Baseline Vision/History: 0 No visual deficits Ability to See in Adequate Light: 0 Adequate Patient Visual Report: No change from baseline Vision Assessment?: No apparent visual deficits  Perception Perception Perception Tested?: No   Praxis Praxis Praxis tested?: Not tested    Pertinent  Vitals/Pain Pain Assessment: Faces Faces Pain Scale: Hurts even more Pain Location: Neck Pain Descriptors / Indicators: Sore;Aching Pain Intervention(s): Limited activity within patient's tolerance;Monitored during session;Repositioned     Hand Dominance     Extremity/Trunk Assessment Upper Extremity Assessment Upper Extremity Assessment: RUE deficits/detail;LUE deficits/detail RUE Deficits / Details: Pt with ulnar drift in both hands and decreased ROM in 3rd, 4th, 5th digits. pt able to grasp coffee cup using bilateral UEs RUE Coordination: decreased fine motor;decreased gross motor LUE Deficits / Details: Pt with ulnar drift in both hands and decreased ROM in 3rd, 4th, 5th digits. pt able to grasp coffee cup using bilateral UEs LUE Coordination: decreased fine motor;decreased gross motor   Lower Extremity Assessment Lower Extremity Assessment: Defer to PT evaluation   Cervical / Trunk Assessment Cervical / Trunk Assessment: Other exceptions Cervical / Trunk Exceptions: s/p cervical surgery   Communication Communication Communication: No difficulties   Cognition Arousal/Alertness: Awake/alert Behavior During Therapy: Anxious Overall Cognitive Status: Within Functional Limits for tasks assessed                                 General Comments: Pt tearful upon topic of dicharge   General Comments       Exercises     Shoulder Instructions      Home Living Family/patient expects to be discharged to:: Skilled nursing facility                             Home Equipment: Wheelchair - manual;Hospital bed   Additional Comments: [facility]      Prior Functioning/Environment Level of Independence: Needs assistance  Gait / Transfers Assistance Needed: Pt reported stand pivot to left side to manual w/c. Staff supervising as needed. ADL's / Homemaking Assistance Needed: Able to perform dressing with assistance to pull pants over hips. Staff  assisting as needed. Communication / Swallowing Assistance Needed: Swallowing assistance and communication [facility]          OT Problem List: Decreased range of motion;Decreased activity tolerance;Decreased strength;Impaired balance (sitting and/or standing);Decreased knowledge of use of DME or AE;Decreased knowledge of precautions;Pain      OT Treatment/Interventions: Self-care/ADL training;Therapeutic exercise;Energy conservation;DME and/or AE instruction    OT Goals(Current goals can be found in the care plan section) Acute Rehab OT Goals Patient Stated Goal: "I want to use my hands again" OT Goal Formulation: With patient Time For Goal Achievement: 03/17/21 Potential to Achieve Goals: Fair  OT Frequency: Min 2X/week   Barriers to D/C:            Co-evaluation              AM-PAC OT "6 Clicks" Daily Activity     Outcome Measure Help from another person eating meals?: A Little Help from another person taking care of personal grooming?: A Little Help from another person toileting, which includes using toliet, bedpan, or urinal?: A Lot Help from another person bathing (including washing, rinsing, drying)?: A Lot Help from another person to put on and taking off regular upper body clothing?: A Lot Help from another person to put on and taking off regular lower body clothing?: A Lot 6 Click Score: 14   End of Session Equipment Utilized During Treatment: Gait belt;Cervical collar Nurse Communication:  (  Spoke with SN about bed mobility sit<>supine technique used to adhere to spinal precautions.)  Activity Tolerance: Patient limited by pain Patient left: in chair;with call bell/phone within reach  OT Visit Diagnosis: Other abnormalities of gait and mobility (R26.89);Unsteadiness on feet (R26.81);History of falling (Z91.81);Pain Pain - part of body:  (neck)                Time: 4503-8882 OT Time Calculation (min): 59 min Charges:  OT General Charges $OT Visit: 1 Visit OT  Evaluation $OT Eval Moderate Complexity: 1 Mod OT Treatments $Self Care/Home Management : 38-52 mins  Benisha Hadaway MSOT, OTR/L Acute Rehab Pager: [phone] Office: [phone]  Theodoro Grist Diannia Hogenson 03/03/2021, 10:46 AM

## 2021-03-03 NOTE — Evaluation (Addendum)
Physical Therapy Evaluation Patient Details Name: Shirley Waters MRN: [id number] DOB: 07-17-1940 Today's Date: 03/03/2021   History of Present Illness  Pt is a 80 year old female who presents s/p Anterior cervical corpectomy of C4, anterior cervical plating from C3-C5, and fusion of C5-C7. PMH including a-fib, CHF, thyroid disorder, DVT (RLE), HTN, Lupus, RA, bilateral knee replacement (1971, 1972), BLE wound debridement 2019.   Clinical Impression  Pt admitted with above diagnosis. At the time of PT eval, pt was able to demonstrate transfers and ambulation with up to mod assist for balance support and safety. Pt with increased difficulty managing bed mobility and finding a comfortable position once on her back with HOB elevated. She was tearful throughout session, stating "What have I done, I never should have done this." Pt was educated on precautions, brace wearing schedule, and appropriate activity progression. Pt currently with functional limitations due to the deficits listed below (see PT Problem List). Pt will benefit from skilled PT to increase their independence and safety with mobility to allow discharge to the venue listed below.      Follow Up Recommendations SNF;Supervision/Assistance - 24 hour    Equipment Recommendations  None recommended by PT    Recommendations for Other Services       Precautions / Restrictions Precautions Precautions: Cervical;Fall Precaution Booklet Issued: Yes (comment) Precaution Comments: Reviewed cervical precautions Required Braces or Orthoses: Cervical Brace Cervical Brace: Hard collar;At all times (Do not remove hard collar; apply in supine) Restrictions Weight Bearing Restrictions: No      Mobility  Bed Mobility Overal bed mobility: Needs Assistance Bed Mobility: Sit to Supine     Supine to sit: Min assist;HOB elevated Sit to supine: Min assist;Mod assist   General bed mobility comments: Attempted to assist pt back to bed with HOB at  90 as pt cannot log roll. Increased assist required to elevate LE's back up into bed and get back supported. Education provided for optimal posture and maintenance of precautions, however upon initiation of movement, pt posteriorly leaned and required heavy mod assist to reposition.    Transfers Overall transfer level: Needs assistance Equipment used: None Transfers: Stand Pivot Transfers   Stand pivot transfers: Min assist       General transfer comment: Pt required min assist to transition from wheelchair to bed, with increased time to take pivotal steps around and switch her supporting hand on the bed from L to R. Pt maintained good posture throughout until preparing to sit, and then began flexing at the trunk. Pt did not make corrective changes with cues.  Ambulation/Gait             General Gait Details: Not tested as pt does not ambulate at baseline.  Stairs            Wheelchair Mobility    Modified Rankin (Stroke Patients Only)       Balance Overall balance assessment: Needs assistance Sitting-balance support: No upper extremity supported;Feet supported Sitting balance-Leahy Scale: Fair     Standing balance support: Bilateral upper extremity supported;During functional activity Standing balance-Leahy Scale: Poor                               Pertinent Vitals/Pain Pain Assessment: Faces Faces Pain Scale: Hurts whole lot Pain Location: Neck Pain Descriptors / Indicators: Sore;Aching;Operative site guarding Pain Intervention(s): Limited activity within patient's tolerance;Monitored during session;Repositioned    Home Living Family/patient expects to be  discharged to:: Skilled nursing facility               Home Equipment: Wheelchair - manual;Hospital bed Additional Comments: [facility]    Prior Function Level of Independence: Needs assistance   Gait / Transfers Assistance Needed: Pt reported stand pivot to left side to  manual w/c. Pt does not walk, transfers only. Staff supervising as needed.  ADL's / Homemaking Assistance Needed: Able to perform dressing with assistance to pull pants over hips. Staff assisting as needed.        Hand Dominance        Extremity/Trunk Assessment   Upper Extremity Assessment Upper Extremity Assessment: Defer to OT evaluation RUE Deficits / Details: Pt with ulnar drift in both hands and decreased ROM in 3rd, 4th, 5th digits. pt able to grasp coffee cup using bilateral UEs RUE Coordination: decreased fine motor;decreased gross motor LUE Deficits / Details: Pt with ulnar drift in both hands and decreased ROM in 3rd, 4th, 5th digits. pt able to grasp coffee cup using bilateral UEs LUE Coordination: decreased fine motor;decreased gross motor    Lower Extremity Assessment Lower Extremity Assessment: LLE deficits/detail LLE Deficits / Details: Pt reports pain and weakness in the LLE and states she essentially cannot put weight through it.    Cervical / Trunk Assessment Cervical / Trunk Assessment: Other exceptions Cervical / Trunk Exceptions: s/p cervical surgery  Communication   Communication: No difficulties  Cognition Arousal/Alertness: Awake/alert Behavior During Therapy: Anxious Overall Cognitive Status: Within Functional Limits for tasks assessed                                 General Comments: Pt tearful throughout session      General Comments      Exercises     Assessment/Plan    PT Assessment Patient needs continued PT services  PT Problem List Decreased strength;Decreased activity tolerance;Decreased balance;Decreased mobility;Decreased knowledge of use of DME;Decreased safety awareness;Decreased knowledge of precautions;Pain       PT Treatment Interventions DME instruction;Stair training;Functional mobility training;Therapeutic activities;Therapeutic exercise;Neuromuscular re-education;Patient/family education    PT Goals  (Current goals can be found in the Care Plan section)  Acute Rehab PT Goals Patient Stated Goal: "I want to use my hands again" PT Goal Formulation: With patient Time For Goal Achievement: 03/10/21 Potential to Achieve Goals: Fair    Frequency Min 5X/week   Barriers to discharge Decreased caregiver support Pt reports she ends up attempting mobility and ADL's on her own as staff does not come to assist her in a timely manner.    Co-evaluation               AM-PAC PT "6 Clicks" Mobility  Outcome Measure Help needed turning from your back to your side while in a flat bed without using bedrails?: A Little Help needed moving from lying on your back to sitting on the side of a flat bed without using bedrails?: A Lot Help needed moving to and from a bed to a chair (including a wheelchair)?: A Little Help needed standing up from a chair using your arms (e.g., wheelchair or bedside chair)?: A Little Help needed to walk in hospital room?: Total Help needed climbing 3-5 steps with a railing? : Total 6 Click Score: 13    End of Session Equipment Utilized During Treatment: Gait belt Activity Tolerance: Patient tolerated treatment well Patient left: in bed;Other (comment) (Transport present  to take pt to CT) Nurse Communication: Mobility status PT Visit Diagnosis: Unsteadiness on feet (R26.81);Pain;Difficulty in walking, not elsewhere classified (R26.2) Pain - part of body:  (neck)    Time: 3729-0211 PT Time Calculation (min) (ACUTE ONLY): 26 min   Charges:   PT Evaluation $PT Eval Moderate Complexity: 1 Mod PT Treatments $Gait Training: 8-22 mins        Conni Slipper, PT, DPT Acute Rehabilitation Services Pager: [phone] Office: [phone]   Marylynn Pearson 03/03/2021, 11:46 AM

## 2021-03-04 ENCOUNTER — Encounter (HOSPITAL_COMMUNITY): Payer: Self-pay | Admitting: Neurosurgery

## 2021-03-04 NOTE — H&P (Signed)
Shirley Waters is an 80 y.o. female.   Chief Complaint: Neck pain numbness tingling weakness in her arms and legs HPI: 80 year old female with a longstanding history of cervical myelopathy has had multiple complications medically over the last couple years is prevented her from having her neck addressed but now she presents for cervical decompression we have extensively talked about the risks and benefits of an anterior cervical corpectomy and fusion we also had discussed posterior cervical augmentation but she was not ready mentally to undergo both stages of that operation today.  We talked extensively about the potential need for due to her age and bone quality and the fact that we are doing a fairly extensive cervical decompression reconstruction.  She understands but we ultimately acquiesced and agreed to proceed forward with a corpectomy and then evaluate and reschedule in a delayed fashion posterior cervical augmentation.  She understands the risks and benefits of proceeding forward with only the anterior part of the operation as well as perioperative course expectations of outcome and alternatives of surgery.  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   DVT (deep venous thrombosis) (HCC)    history of, RLE   Dysrhythmia    Hypertension    Lupus (HCC)    RA (rheumatoid arthritis) (HCC)     Past Surgical History:  Procedure Laterality Date   ABDOMINAL HYSTERECTOMY  1985   cervical disectomy  04/10/2008   4 vertebrae   choleycystectomy  1986   knee replacement Bilateral 1971, 1972   WOUND DEBRIDEMENT Bilateral 03/03/2018   Procedure: DEBRIDEMENT WOUNDS LOWER EXTREMITY;  Surgeon: Nada Libman, MD;  Location: [facility] OR;  Service: Vascular;  Laterality: Bilateral;    Family History  Problem Relation  Age of Onset   Stroke Daughter    Social History:  reports that she quit smoking about 12 years ago. Her smoking use included cigarettes. She has never used smokeless tobacco. She reports that she does not drink alcohol and does not use drugs.  Allergies:  Allergies  Allergen Reactions   Soy Allergy Other (See Comments)    Joint pain   Latex Rash   Levofloxacin Palpitations    Medications Prior to Admission  Medication Sig Dispense Refill   apixaban (ELIQUIS) 5 MG TABS tablet Take 5 mg by mouth 2 (two) times daily. (0900 & 1700)     celecoxib (CELEBREX) 200 MG capsule Take 200 mg by mouth daily as needed (pain.).     digoxin (LANOXIN) 0.125 MG tablet Take 187.5 mcg by mouth daily. (1300)     diltiazem (CARDIZEM CD) 240 MG 24 hr capsule Take 240 mg by mouth daily. (0900)     esomeprazole (NEXIUM) 20 MG capsule Take 20 mg by mouth daily. (0800)     furosemide (LASIX) 40 MG tablet Take 1 tablet (40 mg total) by mouth daily. (Patient taking differently: Take 40 mg by mouth daily in the afternoon. (1300)) 30 tablet 0   hydroxychloroquine (PLAQUENIL) 200 MG tablet Take 200 mg by mouth 2 (two) times daily. (0900 & 2100)     JOHNSONS BABY POWDER, TALC, EX Apply 1 application topically every 12 (twelve) hours as needed (MASD).     methimazole (TAPAZOLE) 5 MG tablet Take 7.5 mg by mouth daily.  metoprolol tartrate (LOPRESSOR) 25 MG tablet Take 25 mg by mouth 2 (two) times daily. (0900 & 2100)     oxyCODONE (ROXICODONE) 15 MG immediate release tablet Take 15 mg by mouth every 6 (six) hours as needed for pain.      polyvinyl alcohol (LIQUIFILM TEARS) 1.4 % ophthalmic solution Place 1 drop into both eyes daily. (0900)     predniSONE (DELTASONE) 10 MG tablet Take 10 mg by mouth daily. (0900)     Propylene Glycol 0.6 % SOLN Place 1 drop into both eyes every 12 (twelve) hours as needed (dry eyes).     ZINC OXIDE EX Place 1 application rectally as needed (skin breakdown prevention/incontinent  episodes). Applied to buttocks & peri area     albuterol (VENTOLIN HFA) 108 (90 Base) MCG/ACT inhaler Inhale 1-2 puffs into the lungs every 4 (four) hours as needed for wheezing or shortness of breath.     metolazone (ZAROXOLYN) 5 MG tablet Take 5 mg by mouth daily as needed (edema/lower extremity pitting edema).      No results found for this or any previous visit (from the past 48 hour(s)). DG Cervical Spine 2 or 3 views  Result Date: 03/02/2021 CLINICAL DATA:  surgery elective. EXAM: DG C-ARM 1-60 MIN; CERVICAL SPINE - 2-3 VIEW FLUOROSCOPY TIME:  Fluoroscopy Time:  30 seconds Radiation Exposure Index (if provided by the fluoroscopic device): 7.17 mGy Number of Acquired Spot Images: 6 COMPARISON:  MRI Nov 14, 2020. FINDINGS: Six total C-arm fluoroscopic images were obtained intraoperatively and submitted for post operative interpretation. Initial images demonstrate localization at the C3-C4 level with anterior approach needle with partially imaged prior ACDF seen at C5-C7. Final image demonstrates removal of the previously seen C5-C7 ACDF and placement of a new anterior plate and screw fixation spanning from C3 to C5 with C4 corpectomy. Please see the performing provider's procedural report for further detail. IMPRESSION: Intraoperative fluoroscopy, as detailed above. Electronically Signed   By: Feliberto Harts M.D.   On: 03/02/2021 12:27   CT CERVICAL SPINE WO CONTRAST  Result Date: 03/03/2021 CLINICAL DATA:  Neck pain, acute, prior c-spine surgery EXAM: CT CERVICAL SPINE WITHOUT CONTRAST TECHNIQUE: Multidetector CT imaging of the cervical spine was performed without intravenous contrast. Multiplanar CT image reconstructions were also generated. COMPARISON:  [facility], including intraoperative fluoroscopy from March 02, 2021 and MRI of the cervical spine Nov 14, 2020. FINDINGS: Alignment: Improved alignment with resolution of the previously seen C3-C4 anterolisthesis. Reversal of the normal cervical  lordosis. Otherwise, similar alignment. Mild widening of the C7-T1 disc space anteriorly, which appears to be similar to prior MRI from May 6, 22. Skull base and vertebrae: Interval removal of previously seen ACDF at C5-C7. Interval placement of ACDF spanning C3-C5 with corpectomy at C4 with cage and autograft. There is some fusion across the posterior elements on the right at C3-C4 and C4-C5 and the left at C3-C4. No evidence hardware fracture or loosening. There is solid osseous fusion across the C5-C6 and C6-C7 disc spaces. No evidence of acute fracture. Vertebral body heights are maintained Soft tissues and spinal canal: No obvious large canal hematoma; however, evaluation limited by CT. Expected postoperative swelling and gas in the prevertebral soft tissues the upper neck. There is a drain the anterior right neck with tip projecting at C4 level. Disc levels: Canal stenosis at C3-C4 appears improved. Initially severe foraminal stenosis bilaterally at C3-C4. Otherwise, similar multilevel degenerative change with canal and foraminal stenosis better characterized on prior MRI from  May 6, 22. Upper chest: Visualized lung apices clear. Other: Atherosclerotic calcification carotid arteries. IMPRESSION: Expected postoperative changes status post removal of the prior ACDF and placement a new C3-C5 ACDF with C4 corpectomy, as detailed above. No evidence of immediate hardware complication. Alignment is improved at C3-C4 with suspected improved canal stenosis and potentially severe bilateral foraminal stenosis at this level. An MRI could further characterize canal/cord/foramina if clinically indicated. Electronically Signed   By: Feliberto Harts M.D.   On: 03/03/2021 11:05   DG C-Arm 1-60 Min  Result Date: 03/02/2021 CLINICAL DATA:  surgery elective. EXAM: DG C-ARM 1-60 MIN; CERVICAL SPINE - 2-3 VIEW FLUOROSCOPY TIME:  Fluoroscopy Time:  30 seconds Radiation Exposure Index (if provided by the fluoroscopic device):  7.17 mGy Number of Acquired Spot Images: 6 COMPARISON:  MRI Nov 14, 2020. FINDINGS: Six total C-arm fluoroscopic images were obtained intraoperatively and submitted for post operative interpretation. Initial images demonstrate localization at the C3-C4 level with anterior approach needle with partially imaged prior ACDF seen at C5-C7. Final image demonstrates removal of the previously seen C5-C7 ACDF and placement of a new anterior plate and screw fixation spanning from C3 to C5 with C4 corpectomy. Please see the performing provider's procedural report for further detail. IMPRESSION: Intraoperative fluoroscopy, as detailed above. Electronically Signed   By: Feliberto Harts M.D.   On: 03/02/2021 12:27    Review of Systems  Musculoskeletal:  Positive for neck pain.  Neurological:  Positive for weakness and numbness.   Blood pressure (!) 143/66, pulse (!) 52, temperature 98.6 F (37 C), temperature source Oral, resp. rate 20, height 5\' 5"  (1.651 m), weight 88.9 kg, SpO2 95 %. Physical Exam HENT:     Head: Normocephalic.     Nose: Nose normal.  Eyes:     Pupils: Pupils are equal, round, and reactive to light.  Cardiovascular:     Rate and Rhythm: Normal rate.  Pulmonary:     Effort: Pulmonary effort is normal.  Abdominal:     General: Abdomen is flat.  Musculoskeletal:        General: Normal range of motion.  Skin:    General: Skin is warm.  Neurological:     Mental Status: She is alert.     Comments: Patient with severe cervical myelopathy nonambulatory has a quadriparesis very subtle mostly manifested by weakness in her hands she has least 4+ out of 5  strength in her legs.  Fractures of both hands.     Assessment/Plan 80 year old presents for anterior cervical corpectomy of C4  Mariam Dollar, MD 03/04/2021, 7:33 AM

## 2021-03-04 NOTE — TOC Initial Note (Signed)
Transition of Care [facility]) - Initial/Assessment Note    Patient Details  Name: Shirley Waters MRN: [id number] Date of Birth: 11-07-1940  [facility] CM/SW Contact:    Vinie Sill, LCSW Phone Number: 03/04/2021, 3:58 PM  Clinical Narrative:                  CSW met with patient at bedside. CSW introduced self and explained role. Patient states she is from [facility]. Patient states she has been there about one year. She expects and is agreeable to returning once medically stable. CSW was give permission to contact Jefm Miles her friend and administrative at  [facility].   She reports having a daughter but they have estranged relationship. She believes it is because of her daughter she is in nursing home. " I told her I did not want to be in nursing home". Patient was tearful at times .  She states her main concern, " I'm basically homeless. I have nowhere to go if I was to get better .I had to sell my home and give my grandson my car." CSW was able to re-direct patient and encourage her to focus on recuperating, staying healthy and for now, She can return to [facility] in [place].   CSW contacted Magda Paganini - left voice message to return call.   Thurmond Butts, MSW, LCSW Clinical Social Worker    Expected Discharge Plan: Skilled Nursing Facility Barriers to Discharge: Continued Medical Work up   Patient Goals and CMS Choice        Expected Discharge Plan and Services Expected Discharge Plan: [place] In-house Referral: Clinical Social Work     Living arrangements for the past 2 months: [place]                                      Prior Living Arrangements/Services Living arrangements for the past 2 months: [place] Lives with:: Self Patient language and need for interpreter reviewed:: No        Need for Family Participation in Patient Care: Yes  (Comment) Care giver support system in place?: Yes (comment)   Criminal Activity/Legal Involvement Pertinent to Current Situation/Hospitalization: No - Comment as needed  Activities of Daily Living Home Assistive Devices/Equipment: Wheelchair, Other (Comment) (lives in nursing home) ADL Screening (condition at time of admission) Patient's cognitive ability adequate to safely complete daily activities?: Yes Is the patient deaf or have difficulty hearing?: Yes (pt has difficulty hearing in right ear--"feels like someone's hand is over my right ear") Does the patient have difficulty seeing, even when wearing glasses/contacts?: No Does the patient have difficulty concentrating, remembering, or making decisions?: No Patient able to express need for assistance with ADLs?: Yes Does the patient have difficulty dressing or bathing?: Yes Independently performs ADLs?: No Communication: Independent Dressing (OT): Needs assistance Grooming: Needs assistance Feeding: Independent Bathing: Needs assistance Toileting: Needs assistance In/Out Bed: Independent Walks in Home: Needs assistance Does the patient have difficulty walking or climbing stairs?: Yes Weakness of Legs: Both Weakness of Arms/Hands: Both  Permission Sought/Granted Permission sought to share information with : Family Supports Permission granted to share information with : Yes, Verbal Permission Granted  Share Information with NAME: Jefm Miles  Permission granted to share info w AGENCY: SNF  Permission granted to share info w Relationship: Friend  Permission granted  to share info w Contact Information: [phone]  Emotional Assessment Appearance:: Appears stated age Attitude/Demeanor/Rapport: Engaged Affect (typically observed): Pleasant, Appropriate, Tearful/Crying Orientation: : Oriented to Self, Oriented to Place, Oriented to  Time, Oriented to Situation Alcohol / Substance Use: Not Applicable Psych Involvement: No  (comment)  Admission diagnosis:  Myelopathy ([place]) [G95.9] Patient Active Problem List   Diagnosis Date Noted   Myelopathy ([place]) 03/02/2021   Cellulitis of great toe of right foot 11/01/2018   Normocytic anemia 03/01/2018   DJD (degenerative joint disease) 03/01/2018   S/P total knee arthroplasty, bilateral 03/01/2018   History of DVT of lower extremity 03/01/2018   Rheumatoid arthritis involving multiple joints ([place])    Permanent atrial fibrillation ([place])    Morbid obesity ([place])    Venous stasis ulcers ([place]) 10/13/2017   Rheumatoid arthritis ([place]) 10/13/2017   Obesity 10/13/2017   Degenerative disc disease, cervical 10/13/2017   Lupus ([place]) 10/13/2017   PCP:  Michell Heinrich, DO Pharmacy:   [facility] [address] AT [address] [place] [address] Phone: [phone] Fax: [phone]     Social Determinants of Health (SDOH) Interventions    Readmission Risk Interventions No flowsheet data found.

## 2021-03-04 NOTE — Progress Notes (Signed)
Physical Therapy Treatment Patient Details Name: Shirley Waters MRN: [id number] DOB: 08/13/1940 Today's Date: 03/04/2021    History of Present Illness Pt is a 80 year old female who presents s/p Anterior cervical corpectomy of C4, anterior cervical plating from C3-C5, and fusion of C5-C7. PMH including a-fib, CHF, thyroid disorder, DVT (RLE), HTN, Lupus, RA, bilateral knee replacement (1971, 1972), BLE wound debridement 2019.    PT Comments    Pt able to recall her spinal precautions and maintain them throughout session with min cuing. She displays lower extremity weakness, more so on the L, impacting her ability to obtain a more upright position in standing and her ability to ambulate. She is at high risk for falls, needing minA for stand step transfers to her L. Will continue to follow acutely. Current recommendations remain appropriate.    Follow Up Recommendations  SNF;Supervision/Assistance - 24 hour     Equipment Recommendations  None recommended by PT    Recommendations for Other Services       Precautions / Restrictions Precautions Precautions: Cervical;Fall Precaution Booklet Issued: Yes (comment) Precaution Comments: Recalls cervical precautions Required Braces or Orthoses: Cervical Brace Cervical Brace: Hard collar;At all times (Do not remove hard collar; apply in supine) Restrictions Weight Bearing Restrictions: No    Mobility  Bed Mobility Overal bed mobility: Needs Assistance Bed Mobility: Supine to Sit       Sit to supine: Min guard;HOB elevated   General bed mobility comments: HOB elevated with pt using bed rails to pull to sit up R EOB, extra time for all aspects, min guard assist for safety. Educated pt on log rolling to reduce strain, but pt rather transition supine > sit. Cues provided to keep head aligned with shoulders to maintain cervical precautions, good compliance.    Transfers Overall transfer level: Needs assistance Equipment used:  None Transfers: Stand Pivot Transfers;Sit to/from Stand Sit to Stand: Min assist Stand pivot transfers: Min assist       General transfer comment: Needed minA to power up and steady with sit to stand and stand step towards her L 1x from EOB > commode and 1x from commode > recliner, transitioning hands during transfer.  Ambulation/Gait Ambulation/Gait assistance: Min assist Gait Distance (Feet): 1 Feet Assistive device: None Gait Pattern/deviations: Decreased step length - left;Decreased stride length;Decreased weight shift to left;Trunk flexed Gait velocity: reduced Gait velocity interpretation: <1.31 ft/sec, indicative of household ambulator General Gait Details: x1 side step to L each transfer with pt maintaining poor posture and displaying difficulty advancing L leg or weight shifting to L leg, minA for steadying with pt transitioning hands between surfaces throughout.   Stairs             Wheelchair Mobility    Modified Rankin (Stroke Patients Only)       Balance Overall balance assessment: Needs assistance Sitting-balance support: No upper extremity supported;Feet supported Sitting balance-Leahy Scale: Fair     Standing balance support: Bilateral upper extremity supported;During functional activity Standing balance-Leahy Scale: Poor                              Cognition Arousal/Alertness: Awake/alert Behavior During Therapy: Anxious Overall Cognitive Status: Within Functional Limits for tasks assessed                                 General Comments: Pt reports anxiety in regards  to fear of falling, but able to power through and direct PT in positioning room to simulate her normal set-up.      Exercises      General Comments        Pertinent Vitals/Pain Pain Assessment: Faces Faces Pain Scale: Hurts little more Pain Location: Neck Pain Descriptors / Indicators: Sore;Aching;Operative site guarding Pain Intervention(s):  Limited activity within patient's tolerance;Monitored during session;Repositioned    Home Living                      Prior Function            PT Goals (current goals can now be found in the care plan section) Acute Rehab PT Goals Patient Stated Goal: to improve PT Goal Formulation: With patient Time For Goal Achievement: 03/10/21 Potential to Achieve Goals: Fair Progress towards PT goals: Progressing toward goals    Frequency    Min 5X/week      PT Plan Current plan remains appropriate    Co-evaluation              AM-PAC PT "6 Clicks" Mobility   Outcome Measure  Help needed turning from your back to your side while in a flat bed without using bedrails?: A Little Help needed moving from lying on your back to sitting on the side of a flat bed without using bedrails?: A Lot Help needed moving to and from a bed to a chair (including a wheelchair)?: A Little Help needed standing up from a chair using your arms (e.g., wheelchair or bedside chair)?: A Little Help needed to walk in hospital room?: Total Help needed climbing 3-5 steps with a railing? : Total 6 Click Score: 13    End of Session Equipment Utilized During Treatment: Gait belt Activity Tolerance: Patient tolerated treatment well Patient left: in chair;with call bell/phone within reach;with chair alarm set   PT Visit Diagnosis: Pain;Difficulty in walking, not elsewhere classified (R26.2);Muscle weakness (generalized) (M62.81);Unsteadiness on feet (R26.81);Other abnormalities of gait and mobility (R26.89) Pain - part of body:  (neck)     Time: 9371-6967 PT Time Calculation (min) (ACUTE ONLY): 29 min  Charges:  $Therapeutic Activity: 23-37 mins                     Shirley Waters, PT, DPT Acute Rehabilitation Services  Pager: [phone] Office: [phone]    Shirley Waters 03/04/2021, 1:30 PM

## 2021-03-04 NOTE — Progress Notes (Signed)
Subjective: Patient reports doing ok, some moderate amount of neck pain. Got into her wheelchair yesterday for a little bit   Objective: Vital signs in last 24 hours: Temp:  [98.6 F (37 C)-98.9 F (37.2 C)] 98.6 F (37 C) (08/24 0828) Pulse Rate:  [52-70] 60 (08/24 0828) Resp:  [16-20] 16 (08/24 0828) BP: (114-159)/(55-73) 122/60 (08/24 0828) SpO2:  [95 %-99 %] 97 % (08/24 0828)  Intake/Output from previous day: 08/23 0701 - 08/24 0700 In: -  Out: 1950 [Urine:1950] Intake/Output this shift: No intake/output data recorded.  Neurologic: Grossly normal  Lab Results: Lab Results  Component Value Date   WBC 5.9 03/21/2018   HGB 8.8 (L) 03/21/2018   HCT 38.2 03/21/2018   MCV 112.7 (H) 03/21/2018   PLT 311 03/21/2018   Lab Results  Component Value Date   INR 1.76 03/07/2018   BMET Lab Results  Component Value Date   NA 135 03/21/2018   K 3.5 03/21/2018   CL 94 (L) 03/21/2018   CO2 30 03/21/2018   GLUCOSE 99 03/21/2018   BUN 24 (H) 03/21/2018   CREATININE 1.10 (H) 03/21/2018   CALCIUM 9.1 03/21/2018    Studies/Results: DG Cervical Spine 2 or 3 views  Result Date: 03/02/2021 CLINICAL DATA:  surgery elective. EXAM: DG C-ARM 1-60 MIN; CERVICAL SPINE - 2-3 VIEW FLUOROSCOPY TIME:  Fluoroscopy Time:  30 seconds Radiation Exposure Index (if provided by the fluoroscopic device): 7.17 mGy Number of Acquired Spot Images: 6 COMPARISON:  MRI Nov 14, 2020. FINDINGS: Six total C-arm fluoroscopic images were obtained intraoperatively and submitted for post operative interpretation. Initial images demonstrate localization at the C3-C4 level with anterior approach needle with partially imaged prior ACDF seen at C5-C7. Final image demonstrates removal of the previously seen C5-C7 ACDF and placement of a new anterior plate and screw fixation spanning from C3 to C5 with C4 corpectomy. Please see the performing provider's procedural report for further detail. IMPRESSION: Intraoperative  fluoroscopy, as detailed above. Electronically Signed   By: Feliberto Harts M.D.   On: 03/02/2021 12:27   CT CERVICAL SPINE WO CONTRAST  Result Date: 03/03/2021 CLINICAL DATA:  Neck pain, acute, prior c-spine surgery EXAM: CT CERVICAL SPINE WITHOUT CONTRAST TECHNIQUE: Multidetector CT imaging of the cervical spine was performed without intravenous contrast. Multiplanar CT image reconstructions were also generated. COMPARISON:  Priors, including intraoperative fluoroscopy from March 02, 2021 and MRI of the cervical spine Nov 14, 2020. FINDINGS: Alignment: Improved alignment with resolution of the previously seen C3-C4 anterolisthesis. Reversal of the normal cervical lordosis. Otherwise, similar alignment. Mild widening of the C7-T1 disc space anteriorly, which appears to be similar to prior MRI from May 6, 22. Skull base and vertebrae: Interval removal of previously seen ACDF at C5-C7. Interval placement of ACDF spanning C3-C5 with corpectomy at C4 with cage and autograft. There is some fusion across the posterior elements on the right at C3-C4 and C4-C5 and the left at C3-C4. No evidence hardware fracture or loosening. There is solid osseous fusion across the C5-C6 and C6-C7 disc spaces. No evidence of acute fracture. Vertebral body heights are maintained Soft tissues and spinal canal: No obvious large canal hematoma; however, evaluation limited by CT. Expected postoperative swelling and gas in the prevertebral soft tissues the upper neck. There is a drain the anterior right neck with tip projecting at C4 level. Disc levels: Canal stenosis at C3-C4 appears improved. Initially severe foraminal stenosis bilaterally at C3-C4. Otherwise, similar multilevel degenerative change with canal and foraminal  stenosis better characterized on prior MRI from May 6, 22. Upper chest: Visualized lung apices clear. Other: Atherosclerotic calcification carotid arteries. IMPRESSION: Expected postoperative changes status post  removal of the prior ACDF and placement a new C3-C5 ACDF with C4 corpectomy, as detailed above. No evidence of immediate hardware complication. Alignment is improved at C3-C4 with suspected improved canal stenosis and potentially severe bilateral foraminal stenosis at this level. An MRI could further characterize canal/cord/foramina if clinically indicated. Electronically Signed   By: Feliberto Harts M.D.   On: 03/03/2021 11:05   DG C-Arm 1-60 Min  Result Date: 03/02/2021 CLINICAL DATA:  surgery elective. EXAM: DG C-ARM 1-60 MIN; CERVICAL SPINE - 2-3 VIEW FLUOROSCOPY TIME:  Fluoroscopy Time:  30 seconds Radiation Exposure Index (if provided by the fluoroscopic device): 7.17 mGy Number of Acquired Spot Images: 6 COMPARISON:  MRI Nov 14, 2020. FINDINGS: Six total C-arm fluoroscopic images were obtained intraoperatively and submitted for post operative interpretation. Initial images demonstrate localization at the C3-C4 level with anterior approach needle with partially imaged prior ACDF seen at C5-C7. Final image demonstrates removal of the previously seen C5-C7 ACDF and placement of a new anterior plate and screw fixation spanning from C3 to C5 with C4 corpectomy. Please see the performing provider's procedural report for further detail. IMPRESSION: Intraoperative fluoroscopy, as detailed above. Electronically Signed   By: Feliberto Harts M.D.   On: 03/02/2021 12:27    Assessment/Plan: Postop day  s/p C4 corpectomy. Continue to work with therapy. Dr. Wynetta Emery and I did review her Ct cervical spine. 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 Will likely keep her in the collar for 2 months.    LOS: 2 days    Shirley Waters [facility] 03/04/2021, 10:38 AM

## 2021-03-05 MED ORDER — DIPHENHYDRAMINE-ZINC ACETATE 2-0.1 % EX CREA
TOPICAL_CREAM | Freq: Every day | CUTANEOUS | Status: DC | PRN
  Filled 2021-03-05: qty 28

## 2021-03-05 NOTE — Progress Notes (Signed)
Subjective: Patient reports some neck pain but tolerable. Tearful today because she wants the use of her hands back.   Objective: Vital signs in last 24 hours: Temp:  [97.6 F (36.4 C)-98.8 F (37.1 C)] 97.6 F (36.4 C) (08/25 0342) Pulse Rate:  [52-63] 52 (08/25 0342) Resp:  [15-16] 15 (08/24 1536) BP: (121-136)/(54-69) 132/54 (08/25 0342) SpO2:  [95 %-98 %] 98 % (08/25 0342)  Intake/Output from previous day: 08/24 0701 - 08/25 0700 In: 800 [P.O.:800] Out: 2350 [Urine:2350] Intake/Output this shift: No intake/output data recorded.  Neurologic: Grossly normal  Lab Results: Lab Results  Component Value Date   WBC 5.9 03/21/2018   HGB 8.8 (L) 03/21/2018   HCT 38.2 03/21/2018   MCV 112.7 (H) 03/21/2018   PLT 311 03/21/2018   Lab Results  Component Value Date   INR 1.76 03/07/2018   BMET Lab Results  Component Value Date   NA 135 03/21/2018   K 3.5 03/21/2018   CL 94 (L) 03/21/2018   CO2 30 03/21/2018   GLUCOSE 99 03/21/2018   BUN 24 (H) 03/21/2018   CREATININE 1.10 (H) 03/21/2018   CALCIUM 9.1 03/21/2018    Studies/Results: CT CERVICAL SPINE WO CONTRAST  Result Date: 03/03/2021 CLINICAL DATA:  Neck pain, acute, prior c-spine surgery EXAM: CT CERVICAL SPINE WITHOUT CONTRAST TECHNIQUE: Multidetector CT imaging of the cervical spine was performed without intravenous contrast. Multiplanar CT image reconstructions were also generated. COMPARISON:  Priors, including intraoperative fluoroscopy from March 02, 2021 and MRI of the cervical spine Nov 14, 2020. FINDINGS: Alignment: Improved alignment with resolution of the previously seen C3-C4 anterolisthesis. Reversal of the normal cervical lordosis. Otherwise, similar alignment. Mild widening of the C7-T1 disc space anteriorly, which appears to be similar to prior MRI from May 6, 22. Skull base and vertebrae: Interval removal of previously seen ACDF at C5-C7. Interval placement of ACDF spanning C3-C5 with corpectomy at C4 with  cage and autograft. There is some fusion across the posterior elements on the right at C3-C4 and C4-C5 and the left at C3-C4. No evidence hardware fracture or loosening. There is solid osseous fusion across the C5-C6 and C6-C7 disc spaces. No evidence of acute fracture. Vertebral body heights are maintained Soft tissues and spinal canal: No obvious large canal hematoma; however, evaluation limited by CT. Expected postoperative swelling and gas in the prevertebral soft tissues the upper neck. There is a drain the anterior right neck with tip projecting at C4 level. Disc levels: Canal stenosis at C3-C4 appears improved. Initially severe foraminal stenosis bilaterally at C3-C4. Otherwise, similar multilevel degenerative change with canal and foraminal stenosis better characterized on prior MRI from May 6, 22. Upper chest: Visualized lung apices clear. Other: Atherosclerotic calcification carotid arteries. IMPRESSION: Expected postoperative changes status post removal of the prior ACDF and placement a new C3-C5 ACDF with C4 corpectomy, as detailed above. No evidence of immediate hardware complication. Alignment is improved at C3-C4 with suspected improved canal stenosis and potentially severe bilateral foraminal stenosis at this level. An MRI could further characterize canal/cord/foramina if clinically indicated. Electronically Signed   By: Feliberto Harts M.D.   On: 03/03/2021 11:05    Assessment/Plan: Postop day 3 C4 corpectomy doing well. Continue aspen collar. At this point I think she is stable to go back to the skilled nursing facility she came from. Will have social worker start to work on this for [place]. Will order benadryl cream for back of neck, looks like the pad is irritating her skin. Please  see if they have a different pad that we can replace this with.    LOS: 3 days    Shirley Waters 03/05/2021, 8:16 AM

## 2021-03-05 NOTE — Care Management Important Message (Signed)
Important Message  Patient Details  Name: Shirley Waters MRN: [id number] Date of Birth: 1940-09-14   Medicare Important Message Given:  Yes     Dorena Bodo 03/05/2021, 3:11 PM

## 2021-03-05 NOTE — Progress Notes (Signed)
Physical Therapy Treatment Patient Details Name: Shirley Waters MRN: [id number] DOB: Mar 14, 1941 Today's Date: 03/05/2021    History of Present Illness Pt is a 80 year old female who presents s/p Anterior cervical corpectomy of C4, anterior cervical plating from C3-C5, and fusion of C5-C7. PMH including a-fib, CHF, thyroid disorder, DVT (RLE), HTN, Lupus, RA, bilateral knee replacement (1971, 1972), BLE wound debridement 2019.    PT Comments    Began session with supine lower extremity and upper extremity bilateral warm-up exercises. Pt reporting arthritis and pain limiting her throughout her body. Pt also with very high level of anxiety in regards to standing upright for exercises, tasks, or gait training, reporting she was afraid of falling or of her arms breaking since he reportedly has a hx of her L wrist fx following pushing up from a chair to transfer. Pt educated on importance of trying to strengthen her L leg and progress with standing if her ultimate goal is to ambulate again. Pt agreeable to attempt at mini-squats but became very anxious and tearful during and thus returned to sitting. Will continue to follow acutely. Current recommendations remain appropriate.   Follow Up Recommendations  SNF;Supervision/Assistance - 24 hour     Equipment Recommendations  None recommended by PT    Recommendations for Other Services       Precautions / Restrictions Precautions Precautions: Cervical;Fall Precaution Booklet Issued: Yes (comment) Precaution Comments: Recalls cervical precautions Required Braces or Orthoses: Cervical Brace Cervical Brace: Hard collar;At all times (Do not remove hard collar; apply in supine) Restrictions Weight Bearing Restrictions: No    Mobility  Bed Mobility Overal bed mobility: Needs Assistance Bed Mobility: Supine to Sit       Sit to supine: Min guard;HOB elevated   General bed mobility comments: HOB elevated with pt using bed rails to pull to sit up R  EOB, extra time for all aspects, min guard assist for safety.    Transfers Overall transfer level: Needs assistance Equipment used: None;Rolling walker (2 wheeled) Transfers: Stand Pivot Transfers;Sit to/from Stand Sit to Stand: Min assist Stand pivot transfers: Min assist       General transfer comment: Needed minA to power up and steady with sit to stand and stand step towards her L 1x from EOB > recliner, pt transitioning hands from bed to recliner for support with transfer. MinA for power up to stand from recliner > RW.  Ambulation/Gait Ambulation/Gait assistance: Min assist Gait Distance (Feet): 1 Feet Assistive device: None Gait Pattern/deviations: Decreased step length - left;Decreased stride length;Decreased weight shift to left;Trunk flexed Gait velocity: reduced Gait velocity interpretation: <1.31 ft/sec, indicative of household ambulator General Gait Details: x1 side step to L each transfer with pt maintaining poor posture and displaying difficulty advancing L leg or weight shifting to L leg, minA for steadying with pt transitioning hands between surfaces throughout.   Stairs             Wheelchair Mobility    Modified Rankin (Stroke Patients Only)       Balance Overall balance assessment: Needs assistance Sitting-balance support: No upper extremity supported;Feet supported Sitting balance-Leahy Scale: Fair     Standing balance support: Bilateral upper extremity supported;During functional activity Standing balance-Leahy Scale: Poor Standing balance comment: UE support and min-modA based on activity.                            Cognition Arousal/Alertness: Awake/alert Behavior During Therapy: Anxious Overall  Cognitive Status: Within Functional Limits for tasks assessed                                 General Comments: Pt very anxious in regards to trying to progress to standing exercises and ultimate goal to try to progress to  ambulating eventually (pt was able to ambulate following knee surgery before getting COVID, per pt). Pt becomes tearful often in regards to anxiety or pain, but pt agreeable to attempts. She became very anxious that she was going to fall when trying to perform mini knee squats in standing, resulting in her sitting herself back down.      Exercises General Exercises - Upper Extremity Shoulder Flexion: AROM;Both;10 reps;Supine Elbow Flexion: AROM;Both;10 reps;Supine Elbow Extension: AROM;Both;10 reps;Supine Digit Composite Flexion: AROM;Both;10 reps;Supine Composite Extension: AROM;Both;10 reps;Supine General Exercises - Lower Extremity Ankle Circles/Pumps: AROM;Both;10 reps;Supine Quad Sets: AROM;Both;10 reps;Supine Long Arc Quad: Left;AROM;10 reps;Seated Heel Slides: AROM;Both;10 reps;Supine Hip ABduction/ADduction: AAROM;Both;10 reps;Supine Straight Leg Raises: AROM;Both;10 reps;Supine Mini-Sqauts: AROM;Both;Other reps (comment) (x2 attempts at mini squats with RW but pt becoming so anxious she had LOB needing modA to recover and pt then sitting herself down in recliner)    General Comments        Pertinent Vitals/Pain Pain Assessment: Faces Faces Pain Scale: Hurts even more Pain Location: Neck, generalized "arthritis" Pain Descriptors / Indicators: Sore;Aching;Operative site guarding Pain Intervention(s): Limited activity within patient's tolerance;Monitored during session;Repositioned    Home Living                      Prior Function            PT Goals (current goals can now be found in the care plan section) Acute Rehab PT Goals Patient Stated Goal: to improve PT Goal Formulation: With patient Time For Goal Achievement: 03/10/21 Potential to Achieve Goals: Fair Progress towards PT goals: Progressing toward goals    Frequency    Min 5X/week      PT Plan Current plan remains appropriate    Co-evaluation              AM-PAC PT "6 Clicks"  Mobility   Outcome Measure  Help needed turning from your back to your side while in a flat bed without using bedrails?: A Little Help needed moving from lying on your back to sitting on the side of a flat bed without using bedrails?: A Lot Help needed moving to and from a bed to a chair (including a wheelchair)?: A Little Help needed standing up from a chair using your arms (e.g., wheelchair or bedside chair)?: A Little Help needed to walk in hospital room?: Total Help needed climbing 3-5 steps with a railing? : Total 6 Click Score: 13    End of Session   Activity Tolerance: Patient tolerated treatment well;Other (comment) (limited by anxiety) Patient left: in chair;with call bell/phone within reach;with chair alarm set Nurse Communication: Mobility status PT Visit Diagnosis: Pain;Difficulty in walking, not elsewhere classified (R26.2);Muscle weakness (generalized) (M62.81);Unsteadiness on feet (R26.81);Other abnormalities of gait and mobility (R26.89) Pain - part of body:  (neck)     Time: 9924-2683 PT Time Calculation (min) (ACUTE ONLY): 39 min  Charges:  $Therapeutic Exercise: 23-37 mins $Therapeutic Activity: 8-22 mins                     Raymond Gurney, PT, DPT Acute Rehabilitation Services  Pager:  [phone] Office: [phone]    Henrene Dodge Pettis 03/05/2021, 12:06 PM

## 2021-03-05 NOTE — Progress Notes (Signed)
Occupational Therapy Treatment Patient Details Name: Shirley Waters MRN: [id number] DOB: Jun 16, 1941 Today's Date: 03/05/2021    History of present illness Pt is a 80 year old female who presents s/p Anterior cervical corpectomy of C4, anterior cervical plating from C3-C5, and fusion of C5-C7. PMH including a-fib, CHF, thyroid disorder, DVT (RLE), HTN, Lupus, RA, bilateral knee replacement (1971, 1972), BLE wound debridement 2019.   OT comments  Pt continues to present with decreased activity tolerance and poor occupational performance. Focused session on problem solving optimized way for completing LB dressing. Providing education for use of reacher and facilitating conversation on techniques for donning depends. Pt quickly frustrated and overwhelmed, crying stating "I will never be independent again and I shouldn't kid myself". Providing active listening and encouraging pt to participate in ADLs as much as she can now; educating that problem solving techniques for ADLs in the goal of OT. Continue to recommend dc to SNF and will continue to follow acutely as admitted.   Follow Up Recommendations  SNF    Equipment Recommendations  None recommended by OT    Recommendations for Other Services PT consult    Precautions / Restrictions Precautions Precautions: Cervical;Fall Precaution Booklet Issued: Yes (comment) Precaution Comments: Recalls cervical precautions Required Braces or Orthoses: Cervical Brace Cervical Brace: Hard collar;At all times (Do not remove hard collar; apply in supine)       Mobility Bed Mobility                    Transfers                      Balance Overall balance assessment: Needs assistance Sitting-balance support: No upper extremity supported;Feet supported Sitting balance-Leahy Scale: Fair     Standing balance support: Bilateral upper extremity supported;During functional activity Standing balance-Leahy Scale: Poor Standing balance  comment: UE support and min-modA based on activity.                           ADL either performed or assessed with clinical judgement   ADL Overall ADL's : Needs assistance/impaired                     Lower Body Dressing: Moderate assistance;With adaptive equipment Lower Body Dressing Details (indicate cue type and reason): Pt participating in problem solving techniques for LB dressing. Providing education on use of reach for donning depends. Pt able to don her pants over her legs with Mod A for reach management and positioning of pants               General ADL Comments: after LB dressing, pt declined any further ADLs     Vision       Perception     Praxis      Cognition Arousal/Alertness: Awake/alert Behavior During Therapy: Anxious Overall Cognitive Status: Within Functional Limits for tasks assessed                                 General Comments: Quickly becoming frustrated and overhwlemed with practicing ADLs. Pt tearful and stating "illd never be independent again and why should I kid myself." also stating she thought the surger ywould fix her hands, it hasn't, and she should have never had the surgery.        Exercises     Shoulder Instructions  General Comments      Pertinent Vitals/ Pain       Pain Assessment: Faces Faces Pain Scale: Hurts even more Pain Location: Neck, generalized "arthritis" Pain Descriptors / Indicators: Sore;Aching;Operative site guarding Pain Intervention(s): Monitored during session;Limited activity within patient's tolerance;Repositioned  Home Living                                          Prior Functioning/Environment              Frequency  Min 2X/week        Progress Toward Goals  OT Goals(current goals can now be found in the care plan section)  Progress towards OT goals: Progressing toward goals  Acute Rehab OT Goals Patient Stated Goal: to  improve OT Goal Formulation: With patient Time For Goal Achievement: 03/17/21 Potential to Achieve Goals: Fair ADL Goals Pt Will Perform Upper Body Dressing: with min assist;sitting Pt Will Perform Lower Body Dressing: with adaptive equipment;with min assist;sit to/from stand Pt Will Transfer to Toilet: ambulating;bedside commode;with min guard assist Additional ADL Goal #1: Pt will perform bed mobility with Min guard A using bed control and bedrails to adhere to cervical precautions in preparation for ADLs Additional ADL Goal #2: Pt will independently verbalize cervical precautions in preparation for ADLs  Plan Discharge plan remains appropriate    Co-evaluation                 AM-PAC OT "6 Clicks" Daily Activity     Outcome Measure   Help from another person eating meals?: A Little Help from another person taking care of personal grooming?: A Little Help from another person toileting, which includes using toliet, bedpan, or urinal?: A Lot Help from another person bathing (including washing, rinsing, drying)?: A Lot Help from another person to put on and taking off regular upper body clothing?: A Lot Help from another person to put on and taking off regular lower body clothing?: A Lot 6 Click Score: 14    End of Session Equipment Utilized During Treatment: Gait belt;Cervical collar  OT Visit Diagnosis: Other abnormalities of gait and mobility (R26.89);Unsteadiness on feet (R26.81);History of falling (Z91.81);Pain Pain - part of body:  (neck)   Activity Tolerance Patient limited by pain   Patient Left in chair;with call bell/phone within reach   Nurse Communication Other (comment) (pt with question about rash on back of neck)        Time: 6122-4497 OT Time Calculation (min): 26 min  Charges: OT General Charges $OT Visit: 1 Visit OT Treatments $Self Care/Home Management : 23-37 mins  Shirley Waters MSOT, OTR/L Acute Rehab Pager: [phone] Office:  [phone]   Shirley Waters 03/05/2021, 4:20 PM

## 2021-03-06 MED ORDER — HYDROCORTISONE 1 % EX CREA: TOPICAL_CREAM | Freq: Four times a day (QID) | CUTANEOUS | Status: DC | PRN

## 2021-03-06 NOTE — Discharge Summary (Addendum)
Physician Discharge Summary  Patient ID: Shirley Waters MRN: [id number] DOB/AGE: 79-02-1941 79 y.o. Estimated body mass index is 32.62 kg/m as calculated from the following:   Height as of this encounter: 5\' 5"  (1.651 m).   Weight as of this encounter: 88.9 kg.   Admit date: 03/02/2021 Discharge date: 03/07/2021 Admission Diagnoses: Cervical spondylitic myelopathy  Discharge Diagnoses: Same Active Problems:   Myelopathy [facility])   Discharged Condition: good  Hospital Course: Patient admitted to hospital underwent anterior cervical corpectomy and postop the patient did very well covering the floor on the floor was ambulating participating with physical therapy tolerating regular diet and back to neurologic baseline and is stable for discharge to the nursing facility on postop day 4  Consults: Significant Diagnostic Studies: Treatments: Anterior cervical corpectomy of C4 Discharge Exam: Blood pressure (!) 138/59, pulse (!) 52, temperature (!) 97.5 F (36.4 C), temperature source Oral, resp. rate 20, height 5\' 5"  (1.651 m), weight 88.9 kg, SpO2 96 %. Strength 4+ out of 5 upper and lower extremities neurologic baseline  Disposition: Nursing facility   Allergies as of 03/06/2021       Reactions   Latex Rash   Levofloxacin Palpitations        Medication List     TAKE these medications    albuterol 108 (90 Base) MCG/ACT inhaler Commonly known as: VENTOLIN HFA Inhale 1-2 puffs into the lungs every 4 (four) hours as needed for wheezing or shortness of breath.   apixaban 5 MG Tabs tablet Commonly known as: ELIQUIS Take 5 mg by mouth 2 (two) times daily. (0900 & 1700)   celecoxib 200 MG capsule Commonly known as: CELEBREX Take 200 mg by mouth daily as needed (pain.).   digoxin 0.125 MG tablet Commonly known as: LANOXIN Take 187.5 mcg by mouth daily. (1300)   diltiazem 240 MG 24 hr capsule Commonly known as: CARDIZEM CD Take 240 mg by mouth daily. (0900)   esomeprazole  20 MG capsule Commonly known as: NEXIUM Take 20 mg by mouth daily. (0800)   furosemide 40 MG tablet Commonly known as: LASIX Take 1 tablet (40 mg total) by mouth daily. What changed:  when to take this additional instructions   hydroxychloroquine 200 MG tablet Commonly known as: PLAQUENIL Take 200 mg by mouth 2 (two) times daily. (0900 & 2100)   JOHNSONS BABY POWDER (TALC) EX Apply 1 application topically every 12 (twelve) hours as needed (MASD).   methimazole 5 MG tablet Commonly known as: TAPAZOLE Take 7.5 mg by mouth daily.   metolazone 5 MG tablet Commonly known as: ZAROXOLYN Take 5 mg by mouth daily as needed (edema/lower extremity pitting edema).   metoprolol tartrate 25 MG tablet Commonly known as: LOPRESSOR Take 25 mg by mouth 2 (two) times daily. (0900 & 2100)   oxyCODONE 15 MG immediate release tablet Commonly known as: ROXICODONE Take 15 mg by mouth every 6 (six) hours as needed for pain.   polyvinyl alcohol 1.4 % ophthalmic solution Commonly known as: LIQUIFILM TEARS Place 1 drop into both eyes daily. (0900)   predniSONE 10 MG tablet Commonly known as: DELTASONE Take 10 mg by mouth daily. (0900)   Propylene Glycol 0.6 % Soln Place 1 drop into both eyes every 12 (twelve) hours as needed (dry eyes).   ZINC OXIDE EX Place 1 application rectally as needed (skin breakdown prevention/incontinent episodes). Applied to buttocks & peri area         Signed: 03/06/2021, 8:09 AM

## 2021-03-06 NOTE — Plan of Care (Signed)

## 2021-03-06 NOTE — TOC Progression Note (Addendum)
Transition of Care [facility]) - Progression Note    Patient Details  Name: Shirley Waters MRN: [id number] Date of Birth: 08-21-1940  Transition of Care [facility]) CM/SW Contact  Terrial Rhodes, LCSWA Phone Number: 03/06/2021, 4:29 PM  Clinical Narrative:      CSW received verbal consult from patients nurse that patient had some questions for CSW. CSW answered all questions for patient. No other concerns at this time.CSW awaiting for call back from Extended Care Unit in [place] to coordinate patients dc plan. CSW will continue to follow and assist with patients dc planning needs.   Expected Discharge Plan: Skilled Nursing Facility Barriers to Discharge: Continued Medical Work up  Expected Discharge Plan and Services Expected Discharge Plan: Skilled Nursing Facility In-house Referral: Clinical Social Work     Living arrangements for the past 2 months: Skilled Nursing Facility Expected Discharge Date: 03/06/21                                     Social Determinants of Health (SDOH) Interventions    Readmission Risk Interventions No flowsheet data found.

## 2021-03-06 NOTE — TOC Progression Note (Signed)
Transition of Care [facility]) - Progression Note    Patient Details  Name: Shirley Waters MRN: [id number] Date of Birth: [birth year]  Transition of Care [facility]) CM/SW Contact  Eduard Roux, [place] Phone Number: 03/06/2021, 11:09 AM  Clinical Narrative:     CSW called [facility] [phone]) transferred to  their Extended Care Unit - left voice message w/Michael(Admin) - informed patient is discharged and requested call back to coordinate discharge plan. CSW awaiting call back.   CSW will continue to follow and assist with discharge planning.   Antony Blackbird, MSW, LCSW Clinical Social Worker     Expected Discharge Plan: Skilled Nursing Facility Barriers to Discharge: Continued Medical Work up  Expected Discharge Plan and Services Expected Discharge Plan: Skilled Nursing Facility In-house Referral: Clinical Social Work     Living arrangements for the past 2 months: Skilled Nursing Facility Expected Discharge Date: 03/06/21                                     Social Determinants of Health (SDOH) Interventions    Readmission Risk Interventions No flowsheet data found.

## 2021-03-06 NOTE — Progress Notes (Signed)
Physical Therapy Treatment Patient Details Name: Shirley Waters MRN: [id number] DOB: June 19, 1941 Today's Date: 03/06/2021    History of Present Illness Pt is a 80 year old female who presents s/p Anterior cervical corpectomy of C4, anterior cervical plating from C3-C5, and fusion of C5-C7. PMH including a-fib, CHF, thyroid disorder, DVT (RLE), HTN, Lupus, RA, bilateral knee replacement (1971, 1972), BLE wound debridement 2019.    PT Comments    Attempted to reduce pt's anxiety with standing through providing pt with a more stable surface of the posterior aspect of the recliner locked in place, mod success. Pt began to shift weight laterally to her L leg in stand, but became anxious and reported hip pain after shifting ~15% of her weight, and she thus returned to sit. Pt becomes tearful and anxious often. Practiced active listening and encouraged pt throughout session. Pt informed that we did not need to do anything she was not comfortable doing, but pt reported a desire to try to stand today. Will continue to follow acutely. Current recommendations remain appropriate.     Follow Up Recommendations  SNF;Supervision/Assistance - 24 hour     Equipment Recommendations  None recommended by PT    Recommendations for Other Services       Precautions / Restrictions Precautions Precautions: Cervical;Fall Precaution Booklet Issued: Yes (comment) Precaution Comments: Recalls cervical precautions Required Braces or Orthoses: Cervical Brace Cervical Brace: Hard collar;At all times (Do not remove hard collar; apply in supine) Restrictions Weight Bearing Restrictions: No    Mobility  Bed Mobility               General bed mobility comments: Pt sitting in chair upon arrival.    Transfers Overall transfer level: Needs assistance Equipment used:  (posterior aspect of chair; UE support on chairs) Transfers: Stand Pivot Transfers;Sit to/from Stand Sit to Stand: Min assist Stand pivot  transfers: Min assist       General transfer comment: sit to stand from recliner 1x and from w/c 2x. Pt coming to stand from w/c to posterior aspect of recliner 1x to decrease her fear of falling, mod success. MinA to power up to stand and steady each bout. Pt prefers to transfer to her L recliner <> w/c, using UEs to transition between surfaces for support. Pt able to advance L leg to pivot between surfaces.  Ambulation/Gait Ambulation/Gait assistance: Min Chemical engineer (Feet): 1 Feet Assistive device:  (UE support on chairs) Gait Pattern/deviations: Decreased stride length;Decreased weight shift to left;Trunk flexed Gait velocity: reduced Gait velocity interpretation: <1.31 ft/sec, indicative of household ambulator General Gait Details: x1 side step to L each transfer with pt maintaining poor posture and displaying difficulty weight shifting to L leg, minA for steadying with pt transitioning hands between surfaces throughout. Pt able to lift and advance L leg without assistance. x1 bout attempted to encourage L weight bearing through standing with UEs supported on posterior aspect of recliner with pt able to shift weight to ~15% weight bearing on L with PT supported L knee, but pt became anxious and reported hip pain and thus returned to sit.   Stairs             Wheelchair Mobility    Modified Rankin (Stroke Patients Only)       Balance Overall balance assessment: Needs assistance Sitting-balance support: No upper extremity supported;Feet supported Sitting balance-Leahy Scale: Fair     Standing balance support: Bilateral upper extremity supported;During functional activity Standing balance-Leahy Scale: Poor Standing balance  comment: UE support and minA.                            Cognition Arousal/Alertness: Awake/alert Behavior During Therapy: Anxious Overall Cognitive Status: Within Functional Limits for tasks assessed                                  General Comments: Pt very anxious in regards to trying to progress to standing exercises and ultimate goal to try to progress to ambulating eventually (pt was able to ambulate following knee surgery before getting COVID, per pt). Pt becomes tearful often in regards to anxiety or pain, but pt agreeable to attempts. Pt began to get tearful and then agitated in regards to discussing trying to stand, thus told pt that if she was not comfortable then we did not need to stand, encouraging pt that we did not need to do anything she did not want to. Then pt became calm and requested to stand, stating "no, I want to try".      Exercises General Exercises - Lower Extremity Long Arc Quad: Seated;Strengthening;Both;20 reps (x10 without resistance and x10 with slight manual resistance from PT)    General Comments        Pertinent Vitals/Pain Pain Assessment: Faces Faces Pain Scale: Hurts even more Pain Location: Neck, generalized "arthritis", hips Pain Descriptors / Indicators: Sore;Operative site guarding;Aching;Moaning;Grimacing;Guarding;Discomfort Pain Intervention(s): Limited activity within patient's tolerance;Monitored during session;Repositioned;Patient requesting pain meds-RN notified    Home Living                      Prior Function            PT Goals (current goals can now be found in the care plan section) Acute Rehab PT Goals Patient Stated Goal: to improve PT Goal Formulation: With patient Time For Goal Achievement: 03/10/21 Potential to Achieve Goals: Fair Progress towards PT goals: Progressing toward goals    Frequency    Min 5X/week      PT Plan Current plan remains appropriate    Co-evaluation              AM-PAC PT "6 Clicks" Mobility   Outcome Measure  Help needed turning from your back to your side while in a flat bed without using bedrails?: A Little Help needed moving from lying on your back to sitting on the side of a flat  bed without using bedrails?: A Lot Help needed moving to and from a bed to a chair (including a wheelchair)?: A Little Help needed standing up from a chair using your arms (e.g., wheelchair or bedside chair)?: A Little Help needed to walk in hospital room?: Total Help needed climbing 3-5 steps with a railing? : Total 6 Click Score: 13    End of Session Equipment Utilized During Treatment: Gait belt Activity Tolerance: Patient tolerated treatment well;Other (comment) (limited by anxiety) Patient left: in chair;with call bell/phone within reach;with chair alarm set Nurse Communication: Mobility status;Patient requests pain meds PT Visit Diagnosis: Pain;Difficulty in walking, not elsewhere classified (R26.2);Muscle weakness (generalized) (M62.81);Unsteadiness on feet (R26.81);Other abnormalities of gait and mobility (R26.89) Pain - part of body:  (neck)     Time: 1610-9604 PT Time Calculation (min) (ACUTE ONLY): 40 min  Charges:  $Therapeutic Activity: 38-52 mins  Raymond Gurney, PT, DPT Acute Rehabilitation Services  Pager: [phone] Office: [phone]    Jewel Baize 03/06/2021, 4:04 PM

## 2021-03-07 MED ORDER — DIPHENHYDRAMINE HCL 50 MG/ML IJ SOLN: 25.0000 mg | Freq: Four times a day (QID) | INTRAMUSCULAR | Status: DC | PRN

## 2021-03-07 MED ORDER — DIPHENHYDRAMINE HCL 25 MG PO CAPS
25.0000 mg | ORAL_CAPSULE | ORAL | Status: DC | PRN
Start: 2021-03-07 — End: 2021-03-10
  Administered 2021-03-07 – 2021-03-09 (×2): 25 mg via ORAL
  Filled 2021-03-07 (×3): qty 1

## 2021-03-07 NOTE — Progress Notes (Signed)
Patient ID: Shirley Waters, female   DOB: 11/18/1940, 80 y.o.   MRN: [id number] BP 131/76   Pulse 87   Temp 98.4 F (36.9 C) (Oral)   Resp 18   Ht 5\' 5"  (1.651 m)   Wt 88.9 kg   SpO2 100%   BMI 32.62 kg/m  Alert and oriented x 4 Moving extremities Awaiting placement for snf Stable exam

## 2021-03-07 NOTE — Progress Notes (Signed)
At 1800, during pt eating, pt started to scream "I am choking, I am choking". RN at the bedside. Pt is very frustrated with the collar, which is pushing and "choking " on her leg. She states her rt side of the lip feels numb and dry RT eye. Collar was adjusted, however every time it was touched, pr screamed it is wrong, there is a pressure. Refused extra foam under the collar. Emotional support was provided. Neuro exam was preformed. And no changes from prior was noted. Rapid response was called and at the bedside. Pt requested pain meds PRN, given. VS stable. Sitting in the chair, is "not happy about this hospital stay and regrets having the surgery". See RR note  Today, no provider was at the bedside, patient was asking for Benadryl PO for her itching under the collar and head. Dr. Mikal Plane was called and orders were given. I was told that Neurosurgery signed off from this patient and they are waiting fos SW to arrange the transfer. Pt was very agitated about the "whole situation". Called to the facility and told me that the facility told her, they have no problem to admit her. The transport that was requested by the patient (due to her neck surgery) was not covered by her insurance and short 10 miles. She told them she is willing to pay out of pocket, as long as the ride was safe for her to ride. SW was paged, however, SW was gone home prior to seeing my message.

## 2021-03-08 NOTE — TOC Progression Note (Signed)
Transition of Care [facility]) - Progression Note    Patient Details  Name: Shirley Waters MRN: [id number] Date of Birth: 1940-07-22  Transition of Care [facility]) CM/SW [place], LCSW Phone Number: 03/08/2021, 8:49 AM  Clinical Narrative:    CSW contacted by RN with request to touch base with patient regarding returning back to nursing facility and her frustration of not going this weekend. CSW spoke with charge RN at [place] unit regarding patient returning and was informed they have not been informed of patient returning and admissions has not at this time accepted patient's return transfer so [facility] team will need to follow-up with Magda Paganini in admissions Monday (per charge RN she is not available over the weekend). CSW met with patient and informed her of this and patient verbalized understanding and shared her frustrations. Patient reported she spoke with the charge RN too yesterday, Benjamine Mola who informed her of the same. 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 Patient's RN came in and CSW inquired if patient had additional questions or concerns and noted none at this time.    Expected Discharge Plan: [place] Barriers to Discharge: Continued Medical Work up  Expected Discharge Plan and Services Expected Discharge Plan: [place] In-house Referral: Clinical Social Work     Living arrangements for the past 2 months: [place] Expected Discharge Date: 03/06/21                                     Social Determinants of Health (SDOH) Interventions    Readmission Risk Interventions No flowsheet data found.

## 2021-03-08 NOTE — Progress Notes (Signed)
Called to evaluate this patient because she had mentioned that the right side of her mouth felt like rubber. No focal deficits noted on exam. Patient has NIHSS 0 on my exam.

## 2021-03-08 NOTE — Progress Notes (Signed)
Patient ID: Shirley Waters, female   DOB: [birth year], 80 y.o.   MRN: [id number] BP 129/71 (BP Location: Left Arm)   Pulse (!) 57   Temp 98.3 F (36.8 C) (Oral)   Resp 19   Ht 5\' 5"  (1.651 m)   Wt 88.9 kg   SpO2 98%   BMI 32.62 kg/m  Alert and oriented x4, speech is clear.  Moving extremities Wound is clean and dry Sitting in chair.

## 2021-03-08 NOTE — Plan of Care (Signed)
  Problem: Safety: Goal: Ability to remain free from injury will improve Outcome: Progressing   Problem: Education: Goal: Ability to verbalize activity precautions or restrictions will improve Outcome: Progressing Goal: Knowledge of the prescribed therapeutic regimen will improve Outcome: Progressing Goal: Understanding of discharge needs will improve Outcome: Progressing   Problem: Activity: Goal: Ability to avoid complications of mobility impairment will improve Outcome: Progressing Goal: Ability to tolerate increased activity will improve Outcome: Progressing Goal: Will remain free from falls Outcome: Progressing   Problem: Bowel/Gastric: Goal: Gastrointestinal status for postoperative course will improve Outcome: Progressing   Problem: Clinical Measurements: Goal: Ability to maintain clinical measurements within normal limits will improve Outcome: Progressing Goal: Postoperative complications will be avoided or minimized Outcome: Progressing Goal: Diagnostic test results will improve Outcome: Progressing   Problem: Pain Management: Goal: Pain level will decrease Outcome: Progressing   Problem: Skin Integrity: Goal: Will show signs of wound healing Outcome: Progressing   Problem: Health Behavior/Discharge Planning: Goal: Identification of resources available to assist in meeting health care needs will improve Outcome: Progressing   Problem: Bladder/Genitourinary: Goal: Urinary functional status for postoperative course will improve Outcome: Progressing   Problem: Education: Goal: Knowledge of General Education information will improve Description: Including pain rating scale, medication(s)/side effects and non-pharmacologic comfort measures Outcome: Progressing   Problem: Health Behavior/Discharge Planning: Goal: Ability to manage health-related needs will improve Outcome: Progressing   Problem: Clinical Measurements: Goal: Ability to maintain clinical  measurements within normal limits will improve Outcome: Progressing Goal: Will remain free from infection Outcome: Progressing Goal: Diagnostic test results will improve Outcome: Progressing Goal: Respiratory complications will improve Outcome: Progressing Goal: Cardiovascular complication will be avoided Outcome: Progressing   Problem: Activity: Goal: Risk for activity intolerance will decrease Outcome: Progressing   Problem: Nutrition: Goal: Adequate nutrition will be maintained Outcome: Progressing   Problem: Coping: Goal: Level of anxiety will decrease Outcome: Progressing   Problem: Elimination: Goal: Will not experience complications related to bowel motility Outcome: Progressing Goal: Will not experience complications related to urinary retention Outcome: Progressing   Problem: Pain Managment: Goal: General experience of comfort will improve Outcome: Progressing   Problem: Safety: Goal: Ability to remain free from injury will improve Outcome: Progressing   Problem: Skin Integrity: Goal: Risk for impaired skin integrity will decrease Outcome: Progressing   

## 2021-03-09 LAB — RESP PANEL BY RT-PCR (FLU A&B, COVID) ARPGX2
Influenza A by PCR: NEGATIVE
Influenza B by PCR: NEGATIVE
SARS Coronavirus 2 by RT PCR: NEGATIVE

## 2021-03-09 MED ORDER — OXYCODONE HCL 15 MG PO TABS: 15.0000 mg | ORAL_TABLET | Freq: Four times a day (QID) | ORAL | 0 refills | Status: AC | PRN

## 2021-03-09 NOTE — Plan of Care (Signed)
  Problem: Safety: Goal: Ability to remain free from injury will improve Outcome: Progressing   Problem: Education: Goal: Ability to verbalize activity precautions or restrictions will improve Outcome: Progressing Goal: Knowledge of the prescribed therapeutic regimen will improve Outcome: Progressing Goal: Understanding of discharge needs will improve Outcome: Progressing   Problem: Activity: Goal: Ability to avoid complications of mobility impairment will improve Outcome: Progressing Goal: Ability to tolerate increased activity will improve Outcome: Progressing Goal: Will remain free from falls Outcome: Progressing   Problem: Bowel/Gastric: Goal: Gastrointestinal status for postoperative course will improve Outcome: Progressing   Problem: Clinical Measurements: Goal: Ability to maintain clinical measurements within normal limits will improve Outcome: Progressing Goal: Postoperative complications will be avoided or minimized Outcome: Progressing Goal: Diagnostic test results will improve Outcome: Progressing   Problem: Pain Management: Goal: Pain level will decrease Outcome: Progressing   Problem: Skin Integrity: Goal: Will show signs of wound healing Outcome: Progressing   Problem: Health Behavior/Discharge Planning: Goal: Identification of resources available to assist in meeting health care needs will improve Outcome: Progressing   Problem: Bladder/Genitourinary: Goal: Urinary functional status for postoperative course will improve Outcome: Progressing   Problem: Education: Goal: Knowledge of General Education information will improve Description: Including pain rating scale, medication(s)/side effects and non-pharmacologic comfort measures Outcome: Progressing   Problem: Health Behavior/Discharge Planning: Goal: Ability to manage health-related needs will improve Outcome: Progressing   Problem: Clinical Measurements: Goal: Ability to maintain clinical  measurements within normal limits will improve Outcome: Progressing Goal: Will remain free from infection Outcome: Progressing Goal: Diagnostic test results will improve Outcome: Progressing Goal: Respiratory complications will improve Outcome: Progressing Goal: Cardiovascular complication will be avoided Outcome: Progressing   Problem: Activity: Goal: Risk for activity intolerance will decrease Outcome: Progressing   Problem: Nutrition: Goal: Adequate nutrition will be maintained Outcome: Progressing   Problem: Coping: Goal: Level of anxiety will decrease Outcome: Progressing   Problem: Elimination: Goal: Will not experience complications related to bowel motility Outcome: Progressing Goal: Will not experience complications related to urinary retention Outcome: Progressing   Problem: Pain Managment: Goal: General experience of comfort will improve Outcome: Progressing   Problem: Safety: Goal: Ability to remain free from injury will improve Outcome: Progressing   Problem: Skin Integrity: Goal: Risk for impaired skin integrity will decrease Outcome: Progressing

## 2021-03-09 NOTE — TOC Progression Note (Signed)
Transition of Care [facility]) - Progression Note    Patient Details  Name: Shirley Waters MRN: [id number] Date of Birth: Feb 27, 1941  Transition of Care [facility]) CM/SW Contact  Eduard Roux, [place] Phone Number: 03/09/2021, 12:20 PM  Clinical Narrative:     CSW informed patient is medically stable for d/c. CSW spoke with Verlon Au, Admission Coordinator for [facility] Extended care. CSW informed CSW can not arranged transport with [facility] because she is outside of  the 50 miles radius service area (she would have to pay out of pocket) and  she can sit in a chair( & she has wheel chair).  Verlon Au requested CSW contact [employer] [facility] # [phone] for transportation needs- the transport service agency will bill their SNF for transportation.   CSW spoke with [facility] [employer] [facility]- the earliest pick up is tomorrow 03/10/2021 @ 11:30 am. CSW will need to fax: discharge summary, covid test and any narcotic scipts to [phone].   CSW called and advised SNF/[facility] [phone] d/c tomorrow.  CSW will continue to follow and assist with discharge planning.  Antony Blackbird, MSW, LCSW Clinical Social Worker    Expected Discharge Plan: Skilled Nursing Facility Barriers to Discharge: Continued Medical Work up  Expected Discharge Plan and Services Expected Discharge Plan: Skilled Nursing Facility In-house Referral: Clinical Social Work     Living arrangements for the past 2 months: Skilled Nursing Facility Expected Discharge Date: 03/06/21                                     Social Determinants of Health (SDOH) Interventions    Readmission Risk Interventions No flowsheet data found.

## 2021-03-09 NOTE — Progress Notes (Signed)
Physical Therapy Treatment Patient Details Name: Shirley Waters MRN: [id number] DOB: 1940/10/25 Today's Date: 03/09/2021    History of Present Illness Pt is a 80 year old female who presents s/p Anterior cervical corpectomy of C4, anterior cervical plating from C3-C5, and fusion of C5-C7. PMH including a-fib, CHF, thyroid disorder, DVT (RLE), HTN, Lupus, RA, bilateral knee replacement (1971, 1972), BLE wound debridement 2019.    PT Comments    Pt participating well in therapy today, tolerating LE strengthening exercises, transfer training from recliner. Pt requires increased time and effort to perform mobility, as well as light physical assist for transfers at this time. Pt tearful during session intermittently given physical status, states if she could walk again she would "kiss the ground". PT provided encouragement throughout session, pt appreciative. Will continue to follow.      Follow Up Recommendations  SNF;Supervision/Assistance - 24 hour     Equipment Recommendations  None recommended by PT    Recommendations for Other Services       Precautions / Restrictions Precautions Precautions: Cervical;Fall Precaution Booklet Issued: Yes (comment) Required Braces or Orthoses: Cervical Brace Cervical Brace: Hard collar;At all times (Do not remove hard collar; apply in supine) Restrictions Weight Bearing Restrictions: No    Mobility  Bed Mobility               General bed mobility comments: up in chair    Transfers Overall transfer level: Needs assistance Equipment used: Rolling walker (2 wheeled) Transfers: Sit to/from Stand Sit to Stand: Min assist         General transfer comment: min assist for rise and steadying, especially when transferring hands from recliner to RW. STS x2 from reliner.  Ambulation/Gait             General Gait Details: NT - pt states "I can't walk"   Stairs             Wheelchair Mobility    Modified Rankin (Stroke  Patients Only)       Balance Overall balance assessment: Needs assistance Sitting-balance support: No upper extremity supported;Feet supported Sitting balance-Leahy Scale: Fair     Standing balance support: Bilateral upper extremity supported;During functional activity Standing balance-Leahy Scale: Poor Standing balance comment: reliant on external assist, can perform dynamic standing tasks (marching with LLE)                            Cognition Arousal/Alertness: Awake/alert Behavior During Therapy: Anxious Overall Cognitive Status: Within Functional Limits for tasks assessed                                 General Comments: pt tearful intermittently throughout session given functional decline over the past year or so. Pt is disheartened and has depressed affect, states "this isn't living". PT provided encouragement, pt very participatory in therapy.      Exercises General Exercises - Lower Extremity Long Arc Quad: AROM;Both;15 reps;Seated Hip ABduction/ADduction: AROM;Both;15 reps;Seated Hip Flexion/Marching: AROM;Both;15 reps;Seated;Left;5 reps;Standing    General Comments        Pertinent Vitals/Pain Pain Assessment: Faces Faces Pain Scale: Hurts little more Pain Location: LLE, L shoulder, neck Pain Descriptors / Indicators: Sore;Discomfort;Grimacing;Guarding Pain Intervention(s): Limited activity within patient's tolerance;Monitored during session;Repositioned    Home Living  Prior Function            PT Goals (current goals can now be found in the care plan section) Acute Rehab PT Goals Patient Stated Goal: to improve PT Goal Formulation: With patient Time For Goal Achievement: 03/10/21 Potential to Achieve Goals: Fair Progress towards PT goals: Progressing toward goals    Frequency    Min 5X/week      PT Plan Current plan remains appropriate    Co-evaluation              AM-PAC PT  "6 Clicks" Mobility   Outcome Measure  Help needed turning from your back to your side while in a flat bed without using bedrails?: A Little Help needed moving from lying on your back to sitting on the side of a flat bed without using bedrails?: A Lot Help needed moving to and from a bed to a chair (including a wheelchair)?: A Little Help needed standing up from a chair using your arms (e.g., wheelchair or bedside chair)?: A Little Help needed to walk in hospital room?: A Little Help needed climbing 3-5 steps with a railing? : A Lot 6 Click Score: 16    End of Session   Activity Tolerance: Patient tolerated treatment well Patient left: in chair;with chair alarm set;with call bell/phone within reach Nurse Communication: Mobility status PT Visit Diagnosis: Pain;Muscle weakness (generalized) (M62.81);Unsteadiness on feet (R26.81) Pain - Right/Left: Left Pain - part of body: Leg (neck)     Time: 5041-3643 PT Time Calculation (min) (ACUTE ONLY): 32 min  Charges:  $Therapeutic Exercise: 8-22 mins $Therapeutic Activity: 8-22 mins                     Marye Round, PT DPT Acute Rehabilitation Services Pager [phone]  Office [phone]    Tyrone Apple E Christain [place] 03/09/2021, 11:46 AM

## 2021-03-10 NOTE — Discharge Instructions (Addendum)
Wear aspen collar at all times for 6-8 weeks. Place guaze, ABD pads, or other barrier between patient's skin and collar pads. D/C foley in one week 03/17/2021.

## 2021-03-10 NOTE — TOC Transition Note (Signed)
Transition of Care [facility]) - CM/SW Discharge Note   Patient Details  Name: Shirley Waters MRN: [id number] Date of Birth: 1941/04/22  Transition of Care [facility]) CM/SW Contact:  Eduard Roux, LCSW Phone Number: 03/10/2021, 10:46 AM   Clinical Narrative:     Patient will Discharge to: [facility] Discharge Date: 03/10/2021 Family Notified: Verlon Au Transport By:[facility] [employer] [facility]   Per MD patient is ready for discharge. RN, patient, and facility notified of discharge. Discharge Summary sent to facility. RN given number for report([phone]). Ambulance transport requested for patient.   Clinical Social Worker signing off.  Antony Blackbird, MSW, LCSW Clinical Social Worker     Final next level of care: Skilled Nursing Facility Barriers to Discharge: Barriers Resolved   Patient Goals and CMS Choice        Discharge Placement              Patient chooses bed at:  [facility]) Patient to be transferred to facility by: [facility] [employer] [facility] Name of family member notified: Verlon Au, admission at [facility]. Patient and family notified of of transfer: 03/10/21  Discharge Plan and Services In-house Referral: Clinical Social Work                                   Social Determinants of Health (SDOH) Interventions     Readmission Risk Interventions No flowsheet data found.

## 2021-03-10 NOTE — Discharge Summary (Signed)
Physician Discharge Summary  Patient ID: Shirley Waters MRN: [id number] DOB/AGE: 03-10-1941 80 y.o. Estimated body mass index is 32.62 kg/m as calculated from the following:   Height as of this encounter: 5\' 5"  (1.651 m).   Weight as of this encounter: 88.9 kg.   Admit date: 03/02/2021 Discharge date: 03/10/2021  Admission Diagnoses: Cervical spondylitic myelopathy  Discharge Diagnoses: Same Active Problems:   Myelopathy [facility])   Discharged Condition: good  Hospital Course: Update discharge summary patient has continued to progress with physical Occupational Therapy and got to her neurologic baseline with regard to being able to care for self independently.  Patient will be transferred back to the facility she is to maintain in a cervical collar 24/7.  She has had some problems with the pads we will see if we can make some adjustments as she has developed a rash on her skin underneath it we do need to try to maintain the collar for at least the next 6 to 8 weeks.  I will plan to follow her up in 2 weeks.  Consults: Significant Diagnostic Studies: Treatments: Anterior cervical corpectomy and fusion Discharge Exam: Blood pressure (!) 121/56, pulse (!) 54, temperature 98.8 F (37.1 C), resp. rate 20, height 5\' 5"  (1.651 m), weight 88.9 kg, SpO2 96 %. Strength 4+ out of 5 baseline  Disposition: Nursing home   Allergies as of 03/10/2021       Reactions   Latex Rash   Levofloxacin Palpitations        Medication List     TAKE these medications    albuterol 108 (90 Base) MCG/ACT inhaler Commonly known as: VENTOLIN HFA Inhale 1-2 puffs into the lungs every 4 (four) hours as needed for wheezing or shortness of breath.   apixaban 5 MG Tabs tablet Commonly known as: ELIQUIS Take 5 mg by mouth 2 (two) times daily. (0900 & 1700)   celecoxib 200 MG capsule Commonly known as: CELEBREX Take 200 mg by mouth daily as needed (pain.).   digoxin 0.125 MG tablet Commonly known as:  LANOXIN Take 187.5 mcg by mouth daily. (1300)   diltiazem 240 MG 24 hr capsule Commonly known as: CARDIZEM CD Take 240 mg by mouth daily. (0900)   esomeprazole 20 MG capsule Commonly known as: NEXIUM Take 20 mg by mouth daily. (0800)   furosemide 40 MG tablet Commonly known as: LASIX Take 1 tablet (40 mg total) by mouth daily. What changed:  when to take this additional instructions   hydroxychloroquine 200 MG tablet Commonly known as: PLAQUENIL Take 200 mg by mouth 2 (two) times daily. (0900 & 2100)   JOHNSONS BABY POWDER (TALC) EX Apply 1 application topically every 12 (twelve) hours as needed (MASD).   methimazole 5 MG tablet Commonly known as: TAPAZOLE Take 7.5 mg by mouth daily.   metolazone 5 MG tablet Commonly known as: ZAROXOLYN Take 5 mg by mouth daily as needed (edema/lower extremity pitting edema).   metoprolol tartrate 25 MG tablet Commonly known as: LOPRESSOR Take 25 mg by mouth 2 (two) times daily. (0900 & 2100)   oxyCODONE 15 MG immediate release tablet Commonly known as: ROXICODONE Take 1 tablet (15 mg total) by mouth every 6 (six) hours as needed for pain.   polyvinyl alcohol 1.4 % ophthalmic solution Commonly known as: LIQUIFILM TEARS Place 1 drop into both eyes daily. (0900)   predniSONE 10 MG tablet Commonly known as: DELTASONE Take 10 mg by mouth daily. (0900)   Propylene Glycol 0.6 % Soln Place  1 drop into both eyes every 12 (twelve) hours as needed (dry eyes).   ZINC OXIDE EX Place 1 application rectally as needed (skin breakdown prevention/incontinent episodes). Applied to buttocks & peri area         Signed: Mariam Dollar 03/10/2021, 7:50 AM

## 2021-03-10 NOTE — Progress Notes (Signed)
Occupational Therapy Treatment Patient Details Name: Shirley Waters MRN: [id number] DOB: 1940-10-18 Today's Date: 03/10/2021    History of present illness Pt is a 80 year old female who presents s/p Anterior cervical corpectomy of C4, anterior cervical plating from C3-C5, and fusion of C5-C7. PMH including a-fib, CHF, thyroid disorder, DVT (RLE), HTN, Lupus, RA, bilateral knee replacement (1971, 1972), BLE wound debridement 2019.   OT comments  Pt presented sitting in chair and agreed to therapy. Pt reported at this time due to decrease in FM skills they do not believe they can use at this time. At this time the pt was able to complete sit to stand transfer with increase time and supervision as pt reports they need to have time to place hands on chair to walker. Pt was able to complete unilateral support of LUE while completion of donning LE and less then 10 seconds to complete no BUE support. Pt reported they can not place any weight onto LLE and decline any ambulation.  Pt currently with functional limitations due to the deficits listed below (see OT Problem List).  Pt will benefit from skilled OT to increase their safety and independence with ADL and functional mobility for ADL to facilitate discharge to venue listed below.    Follow Up Recommendations  SNF    Equipment Recommendations  None recommended by OT    Recommendations for Other Services PT consult    Precautions / Restrictions Precautions Precautions: Cervical;Fall Precaution Booklet Issued: No Precaution Comments: Recalls cervical precautions Required Braces or Orthoses: Cervical Brace Restrictions Weight Bearing Restrictions: No       Mobility Bed Mobility Overal bed mobility: Needs Assistance (presented sitting in chair)                  Transfers Overall transfer level: Needs assistance Equipment used: Rolling walker (2 wheeled) Transfers: Sit to/from Stand Sit to Stand: Supervision               Balance Overall balance assessment: Needs assistance Sitting-balance support: Feet supported Sitting balance-Leahy Scale: Good     Standing balance support: Bilateral upper extremity supported (Pt was able to stand for less then 10 seconds without UE support) Standing balance-Leahy Scale: Poor                             ADL either performed or assessed with clinical judgement   ADL Overall ADL's : Needs assistance/impaired Eating/Feeding: Set up;Sitting   Grooming: Minimal assistance;Sitting   Upper Body Bathing: Maximal assistance;Sitting   Lower Body Bathing: Maximal assistance;Sit to/from stand   Upper Body Dressing : Maximal assistance;Sitting   Lower Body Dressing: Moderate assistance;Cueing for safety;Cueing for sequencing;Sit to/from stand Lower Body Dressing Details (indicate cue type and reason): Pt at this time able to complete some parts of donning LE especially on R side once in standing but due to decreas in FM control limited particiaption in any AE use and feels more frustrated to attempt to use               General ADL Comments: Pt declined any ambulation at this time     Vision   Vision Assessment?: No apparent visual deficits   Perception     Praxis      Cognition Arousal/Alertness: Awake/alert Behavior During Therapy: WFL for tasks assessed/performed Overall Cognitive Status: Within Functional Limits for tasks assessed  General Comments: Pt reported on how they have a certian way of doing things        Exercises General Exercises - Upper Extremity Shoulder Flexion:  (Pt educated on how to complete towel slides)   Shoulder Instructions       General Comments      Pertinent Vitals/ Pain       Pain Assessment: Faces Faces Pain Scale: Hurts a little bit Pain Location: cervical Pain Descriptors / Indicators: Sore;Discomfort;Grimacing;Guarding Pain Intervention(s): Limited  activity within patient's tolerance  Home Living                                          Prior Functioning/Environment              Frequency  Min 2X/week        Progress Toward Goals  OT Goals(current goals can now be found in the care plan section)  Progress towards OT goals: Progressing toward goals  Acute Rehab OT Goals Patient Stated Goal: to use my hands more OT Goal Formulation: With patient Time For Goal Achievement: 03/17/21 Potential to Achieve Goals: Fair ADL Goals Pt Will Perform Upper Body Dressing: with min assist;sitting Pt Will Perform Lower Body Dressing: with adaptive equipment;with min assist;sit to/from stand Pt Will Transfer to Toilet: ambulating;bedside commode;with min guard assist Additional ADL Goal #1: Pt will perform bed mobility with Min guard A using bed control and bedrails to adhere to cervical precautions in preparation for ADLs Additional ADL Goal #2: Pt will independently verbalize cervical precautions in preparation for ADLs  Plan Discharge plan remains appropriate    Co-evaluation                 AM-PAC OT "6 Clicks" Daily Activity     Outcome Measure   Help from another person eating meals?: A Little Help from another person taking care of personal grooming?: A Little Help from another person toileting, which includes using toliet, bedpan, or urinal?: A Lot Help from another person bathing (including washing, rinsing, drying)?: A Lot Help from another person to put on and taking off regular upper body clothing?: A Lot Help from another person to put on and taking off regular lower body clothing?: A Lot 6 Click Score: 14    End of Session Equipment Utilized During Treatment: Gait belt;Rolling walker;Cervical collar  OT Visit Diagnosis: Other abnormalities of gait and mobility (R26.89);Unsteadiness on feet (R26.81);History of falling (Z91.81);Pain Pain - Right/Left:  (cervical)   Activity Tolerance  Patient limited by pain   Patient Left in chair;with call bell/phone within reach   Nurse Communication          Time: 8022-3361 OT Time Calculation (min): 27 min  Charges: OT General Charges $OT Visit: 1 Visit OT Treatments $Self Care/Home Management : 23-37 mins  Alphia Moh OTR/L  Acute Rehab Services  [phone] office number [phone] pager number    Alphia Moh 03/10/2021, 11:13 AM

## 2021-03-10 NOTE — Plan of Care (Signed)
  Problem: Safety: Goal: Ability to remain free from injury will improve Outcome: Progressing   Problem: Education: Goal: Ability to verbalize activity precautions or restrictions will improve Outcome: Progressing Goal: Knowledge of the prescribed therapeutic regimen will improve Outcome: Progressing Goal: Understanding of discharge needs will improve Outcome: Progressing   Problem: Activity: Goal: Ability to avoid complications of mobility impairment will improve Outcome: Progressing Goal: Ability to tolerate increased activity will improve Outcome: Progressing Goal: Will remain free from falls Outcome: Progressing   Problem: Bowel/Gastric: Goal: Gastrointestinal status for postoperative course will improve Outcome: Progressing   Problem: Clinical Measurements: Goal: Ability to maintain clinical measurements within normal limits will improve Outcome: Progressing Goal: Postoperative complications will be avoided or minimized Outcome: Progressing Goal: Diagnostic test results will improve Outcome: Progressing   Problem: Pain Management: Goal: Pain level will decrease Outcome: Progressing   Problem: Skin Integrity: Goal: Will show signs of wound healing Outcome: Progressing   Problem: Health Behavior/Discharge Planning: Goal: Identification of resources available to assist in meeting health care needs will improve Outcome: Progressing   Problem: Bladder/Genitourinary: Goal: Urinary functional status for postoperative course will improve Outcome: Progressing   Problem: Education: Goal: Knowledge of General Education information will improve Description: Including pain rating scale, medication(s)/side effects and non-pharmacologic comfort measures Outcome: Progressing   Problem: Health Behavior/Discharge Planning: Goal: Ability to manage health-related needs will improve Outcome: Progressing   Problem: Clinical Measurements: Goal: Ability to maintain clinical  measurements within normal limits will improve Outcome: Progressing Goal: Will remain free from infection Outcome: Progressing Goal: Diagnostic test results will improve Outcome: Progressing Goal: Respiratory complications will improve Outcome: Progressing Goal: Cardiovascular complication will be avoided Outcome: Progressing   Problem: Activity: Goal: Risk for activity intolerance will decrease Outcome: Progressing   Problem: Nutrition: Goal: Adequate nutrition will be maintained Outcome: Progressing   Problem: Coping: Goal: Level of anxiety will decrease Outcome: Progressing   Problem: Elimination: Goal: Will not experience complications related to bowel motility Outcome: Progressing Goal: Will not experience complications related to urinary retention Outcome: Progressing   Problem: Pain Managment: Goal: General experience of comfort will improve Outcome: Progressing   Problem: Safety: Goal: Ability to remain free from injury will improve Outcome: Progressing   Problem: Skin Integrity: Goal: Risk for impaired skin integrity will decrease Outcome: Progressing   

## 2021-06-16 ENCOUNTER — Other Ambulatory Visit: Payer: Self-pay | Admitting: Neurosurgery

## 2021-06-16 DIAGNOSIS — G959 Disease of spinal cord, unspecified: Secondary | ICD-10-CM

## 2023-07-07 IMAGING — CT CT CERVICAL SPINE W/O CM
3 of 4 series · 10 of 33 positions shown, 11 images · non-contrast
Comparison: Priors, including intraoperative fluoroscopy from
March 02, 2021 and MRI of the cervical spine November 14, 2020.

CLINICAL DATA: Neck pain, acute, prior c-spine surgery

EXAM:
CT CERVICAL SPINE WITHOUT CONTRAST
TECHNIQUE: Multidetector CT imaging of the cervical spine was performed without
intravenous contrast. Multiplanar CT image reconstructions were also
generated.

[Series 5: c_spine 2.0 st · axial · 0.44mm/px · z∈[-580,-520]mm · 2 of 92 slices shown, 3 images]
[im 31/92  soft-tissue]
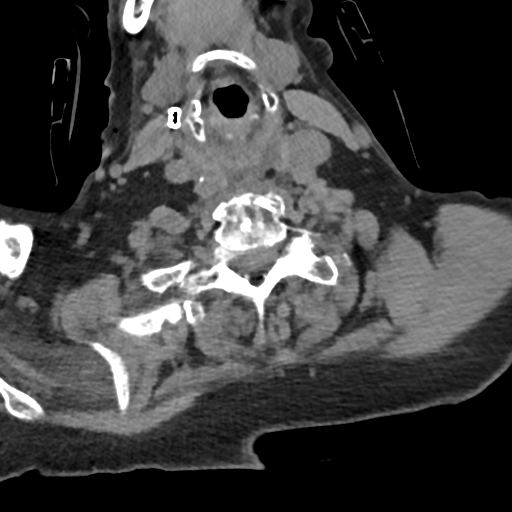
[im 31/92  bone]
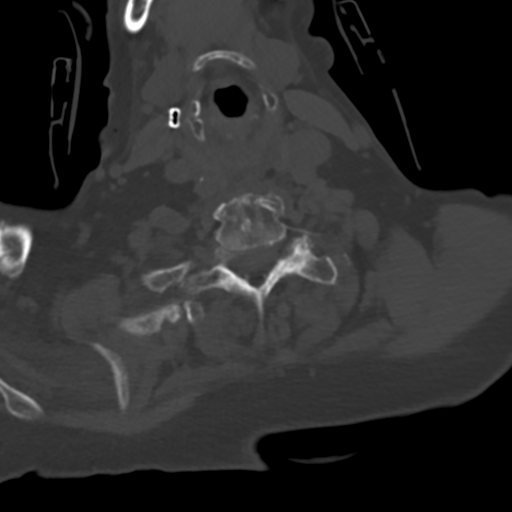
[im 61/92  bone]
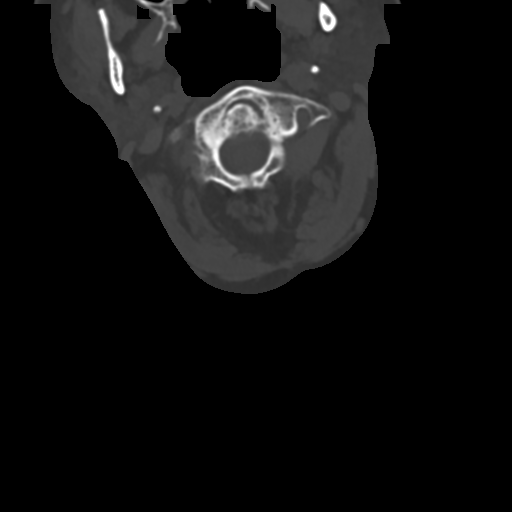

[Series 8: coronal bone · coronal · 0.27mm/px · 3 of 61 slices shown]
[im 17/61  bone]
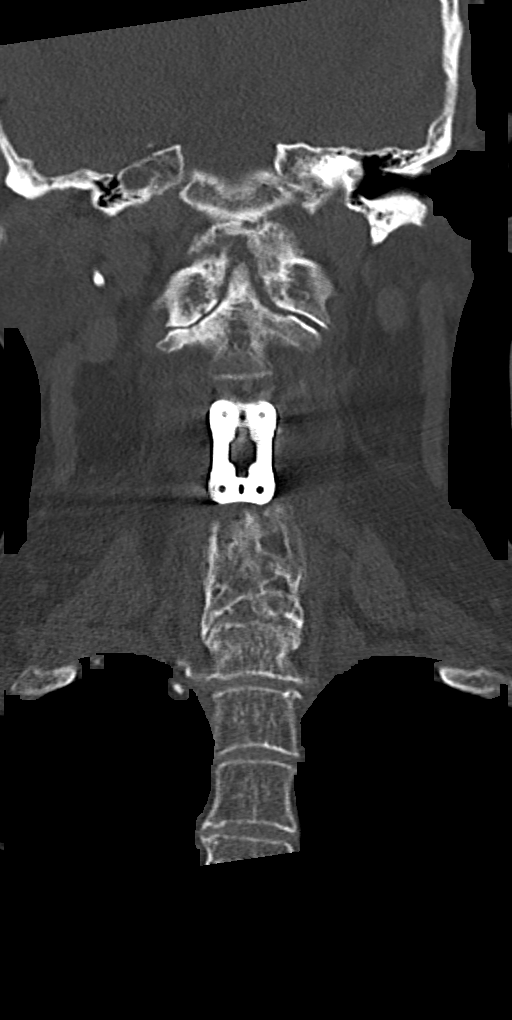
[im 26/61  bone]
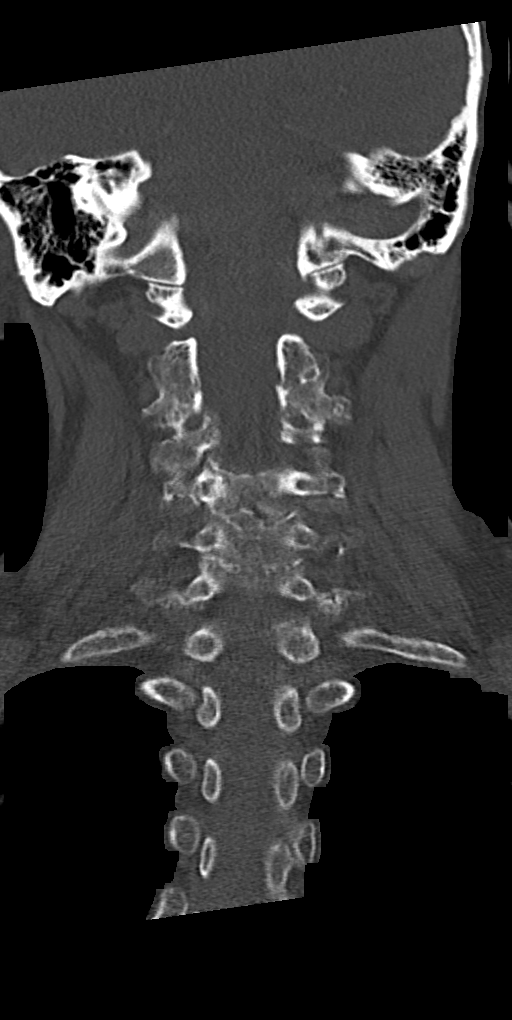
[im 35/61  bone]
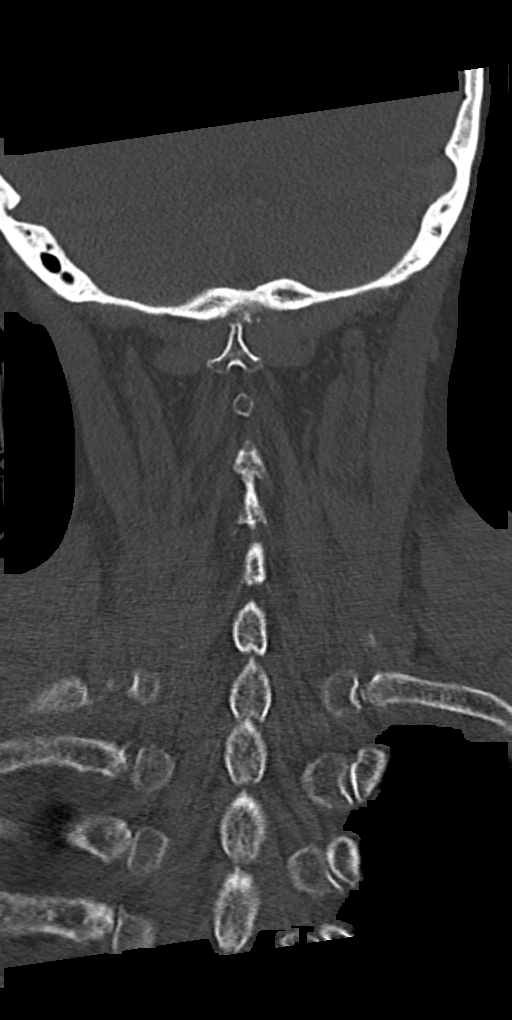

[Series 9: sagittal bone · sagittal · 0.27mm/px · 5 of 61 slices shown]
[im 21/61  bone]
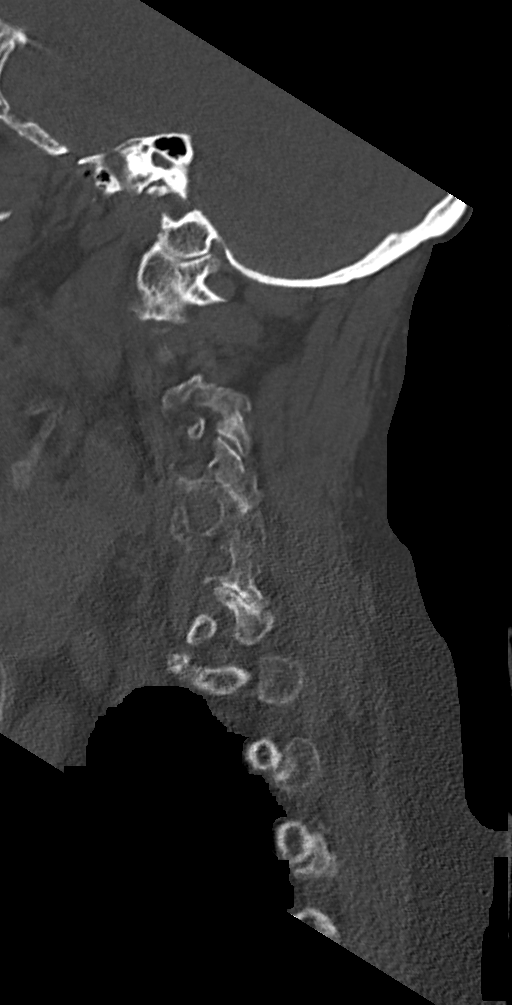
[im 26/61  bone]
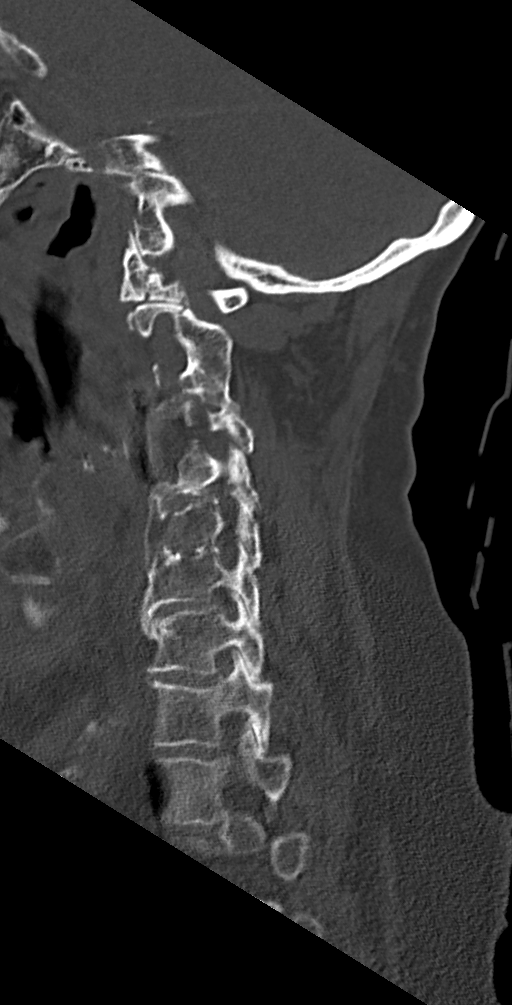
[im 31/61  bone]
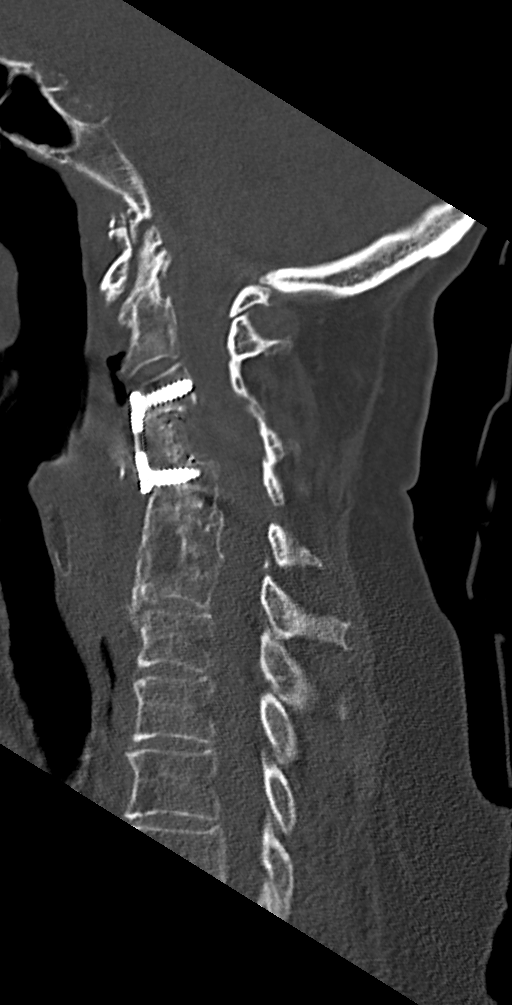
[im 36/61  bone]
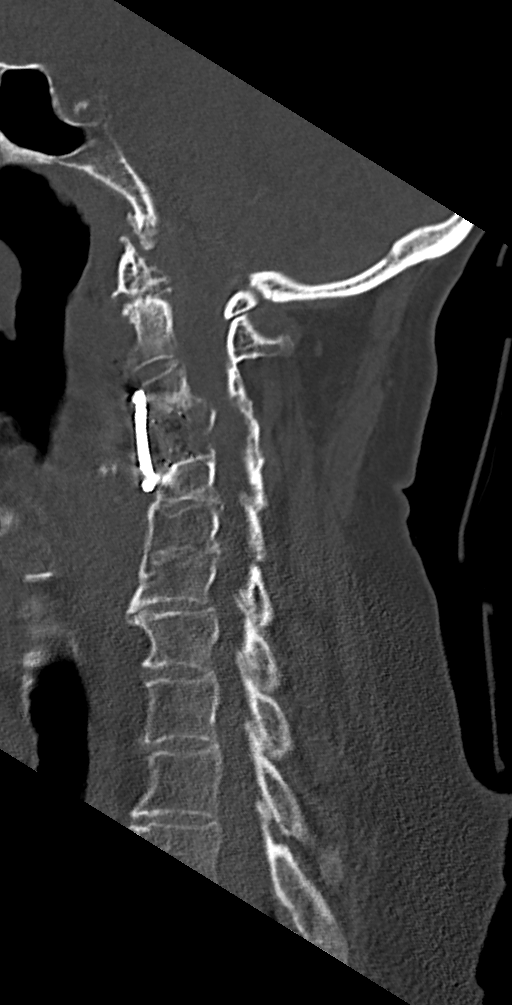
[im 41/61  bone]
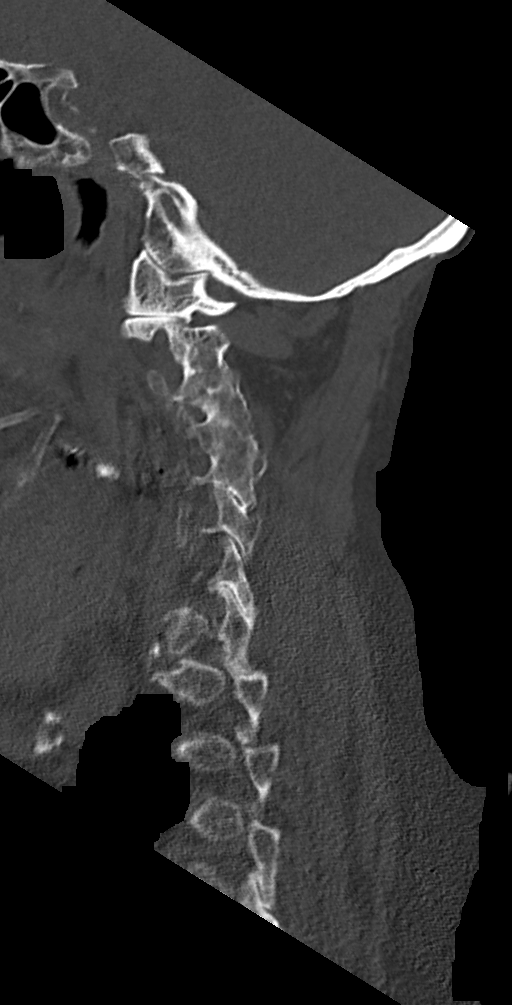

[10 of 33 positions shown; findings below may reference images not displayed]

FINDINGS: Alignment: Improved alignment with resolution of the previously seen
C3-C4 anterolisthesis. Reversal of the normal cervical lordosis.
Otherwise, similar alignment. Mild widening of the C7-T1 disc space
anteriorly, which appears to be similar to prior MRI from November 14, 20.

Skull base and vertebrae: Interval removal of previously seen ACDF
at C5-C7. Interval placement of ACDF spanning C3-C5 with corpectomy
at C4 with cage and autograft. There is some fusion across the
posterior elements on the right at C3-C4 and C4-C5 and the left at
C3-C4. No evidence hardware fracture or loosening. There is solid
osseous fusion across the C5-C6 and C6-C7 disc spaces. No evidence
of acute fracture. Vertebral body heights are maintained

Soft tissues and spinal canal: No obvious large canal hematoma;
however, evaluation limited by CT. Expected postoperative swelling
and gas in the prevertebral soft tissues the upper neck. There is a
drain the anterior right neck with tip projecting at C4 level.

Disc levels: Canal stenosis at C3-C4 appears improved. Initially
severe foraminal stenosis bilaterally at C3-C4. Otherwise, similar
multilevel degenerative change with canal and foraminal stenosis
better characterized on prior MRI from November 14, 20.

Upper chest: Visualized lung apices clear.

Other: Atherosclerotic calcification carotid arteries.
IMPRESSION: Expected postoperative changes status post removal of the prior ACDF
and placement a new C3-C5 ACDF with C4 corpectomy, as detailed
above. No evidence of immediate hardware complication. Alignment is
improved at C3-C4 with suspected improved canal stenosis and
potentially severe bilateral foraminal stenosis at this level. An
MRI could further characterize canal/cord/foramina if clinically
indicated.
